# Patient Record
Sex: Male | Born: 1960 | Race: White | Hispanic: No | Marital: Married | Smoking: Never smoker
Health system: Southern US, Community
[De-identification: ages and names within clinical notes are randomized; demographics above are authoritative.]

## PROBLEM LIST (undated history)

## (undated) DIAGNOSIS — I639 Cerebral infarction, unspecified: Secondary | ICD-10-CM

## (undated) DIAGNOSIS — E785 Hyperlipidemia, unspecified: Secondary | ICD-10-CM

## (undated) DIAGNOSIS — K219 Gastro-esophageal reflux disease without esophagitis: Secondary | ICD-10-CM

## (undated) DIAGNOSIS — E041 Nontoxic single thyroid nodule: Secondary | ICD-10-CM

## (undated) HISTORY — DX: Cerebral infarction, unspecified: I63.9

## (undated) HISTORY — DX: Nontoxic single thyroid nodule: E04.1

## (undated) HISTORY — PX: SKIN CANCER EXCISION: SHX779

## (undated) HISTORY — DX: Hyperlipidemia, unspecified: E78.5

## (undated) HISTORY — PX: THYROIDECTOMY, PARTIAL: SHX18

## (undated) HISTORY — DX: Gastro-esophageal reflux disease without esophagitis: K21.9

---

## 2003-12-11 ENCOUNTER — Encounter: Payer: Self-pay | Admitting: Gastroenterology

## 2003-12-11 DIAGNOSIS — K222 Esophageal obstruction: Secondary | ICD-10-CM

## 2003-12-11 DIAGNOSIS — K209 Esophagitis, unspecified without bleeding: Secondary | ICD-10-CM | POA: Insufficient documentation

## 2005-12-12 ENCOUNTER — Ambulatory Visit: Payer: Self-pay | Admitting: Gastroenterology

## 2006-03-09 ENCOUNTER — Encounter: Admission: RE | Admit: 2006-03-09 | Discharge: 2006-03-09 | Payer: Self-pay | Admitting: General Surgery

## 2006-03-09 ENCOUNTER — Encounter (INDEPENDENT_AMBULATORY_CARE_PROVIDER_SITE_OTHER): Payer: Self-pay | Admitting: Specialist

## 2006-03-09 ENCOUNTER — Other Ambulatory Visit: Admission: RE | Admit: 2006-03-09 | Discharge: 2006-03-09 | Payer: Self-pay | Admitting: Interventional Radiology

## 2006-03-19 ENCOUNTER — Inpatient Hospital Stay (HOSPITAL_COMMUNITY): Admission: EM | Admit: 2006-03-19 | Discharge: 2006-03-25 | Payer: Self-pay | Admitting: Emergency Medicine

## 2006-03-28 ENCOUNTER — Encounter: Admission: RE | Admit: 2006-03-28 | Discharge: 2006-04-27 | Payer: Self-pay | Admitting: Neurology

## 2006-06-30 ENCOUNTER — Ambulatory Visit (HOSPITAL_COMMUNITY): Admission: RE | Admit: 2006-06-30 | Discharge: 2006-06-30 | Payer: Self-pay | Admitting: Neurology

## 2006-11-30 ENCOUNTER — Encounter: Admission: RE | Admit: 2006-11-30 | Discharge: 2006-11-30 | Payer: Self-pay | Admitting: General Surgery

## 2006-11-30 ENCOUNTER — Encounter (INDEPENDENT_AMBULATORY_CARE_PROVIDER_SITE_OTHER): Payer: Self-pay | Admitting: *Deleted

## 2006-11-30 ENCOUNTER — Other Ambulatory Visit: Admission: RE | Admit: 2006-11-30 | Discharge: 2006-11-30 | Payer: Self-pay | Admitting: Interventional Radiology

## 2006-12-05 ENCOUNTER — Ambulatory Visit: Payer: Self-pay | Admitting: Gastroenterology

## 2006-12-25 ENCOUNTER — Ambulatory Visit: Payer: Self-pay | Admitting: Internal Medicine

## 2007-01-10 ENCOUNTER — Ambulatory Visit (HOSPITAL_COMMUNITY): Admission: RE | Admit: 2007-01-10 | Discharge: 2007-01-11 | Payer: Self-pay | Admitting: General Surgery

## 2007-01-10 ENCOUNTER — Encounter (INDEPENDENT_AMBULATORY_CARE_PROVIDER_SITE_OTHER): Payer: Self-pay | Admitting: Specialist

## 2008-10-06 IMAGING — CR DG CHEST 2V
2 series · 2 of 2 positions shown · non-contrast
Comparison: 03/19/06.

CLINICAL DATA: Right thyroid nodule.  Preop for partial thyroidectomy. 
 CHEST - 2 VIEW:

[view not recorded (1 of 2)]
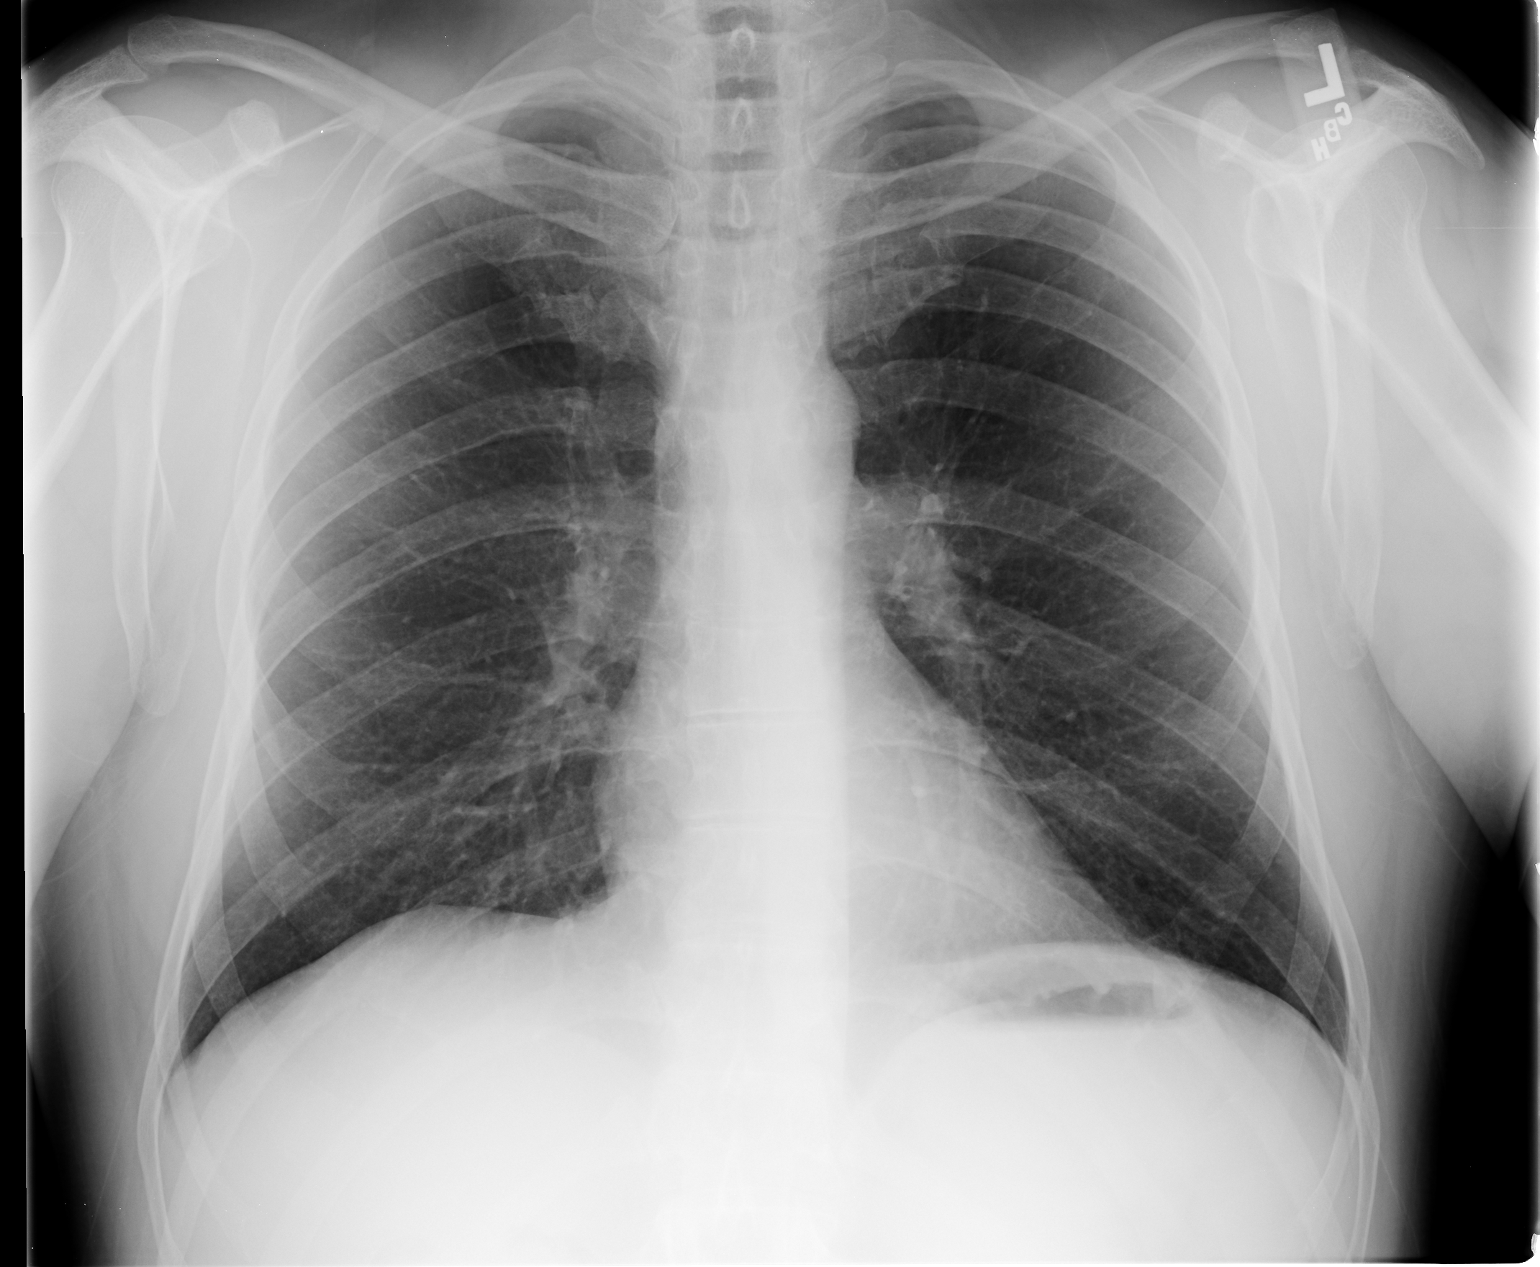

[view not recorded (2 of 2)]
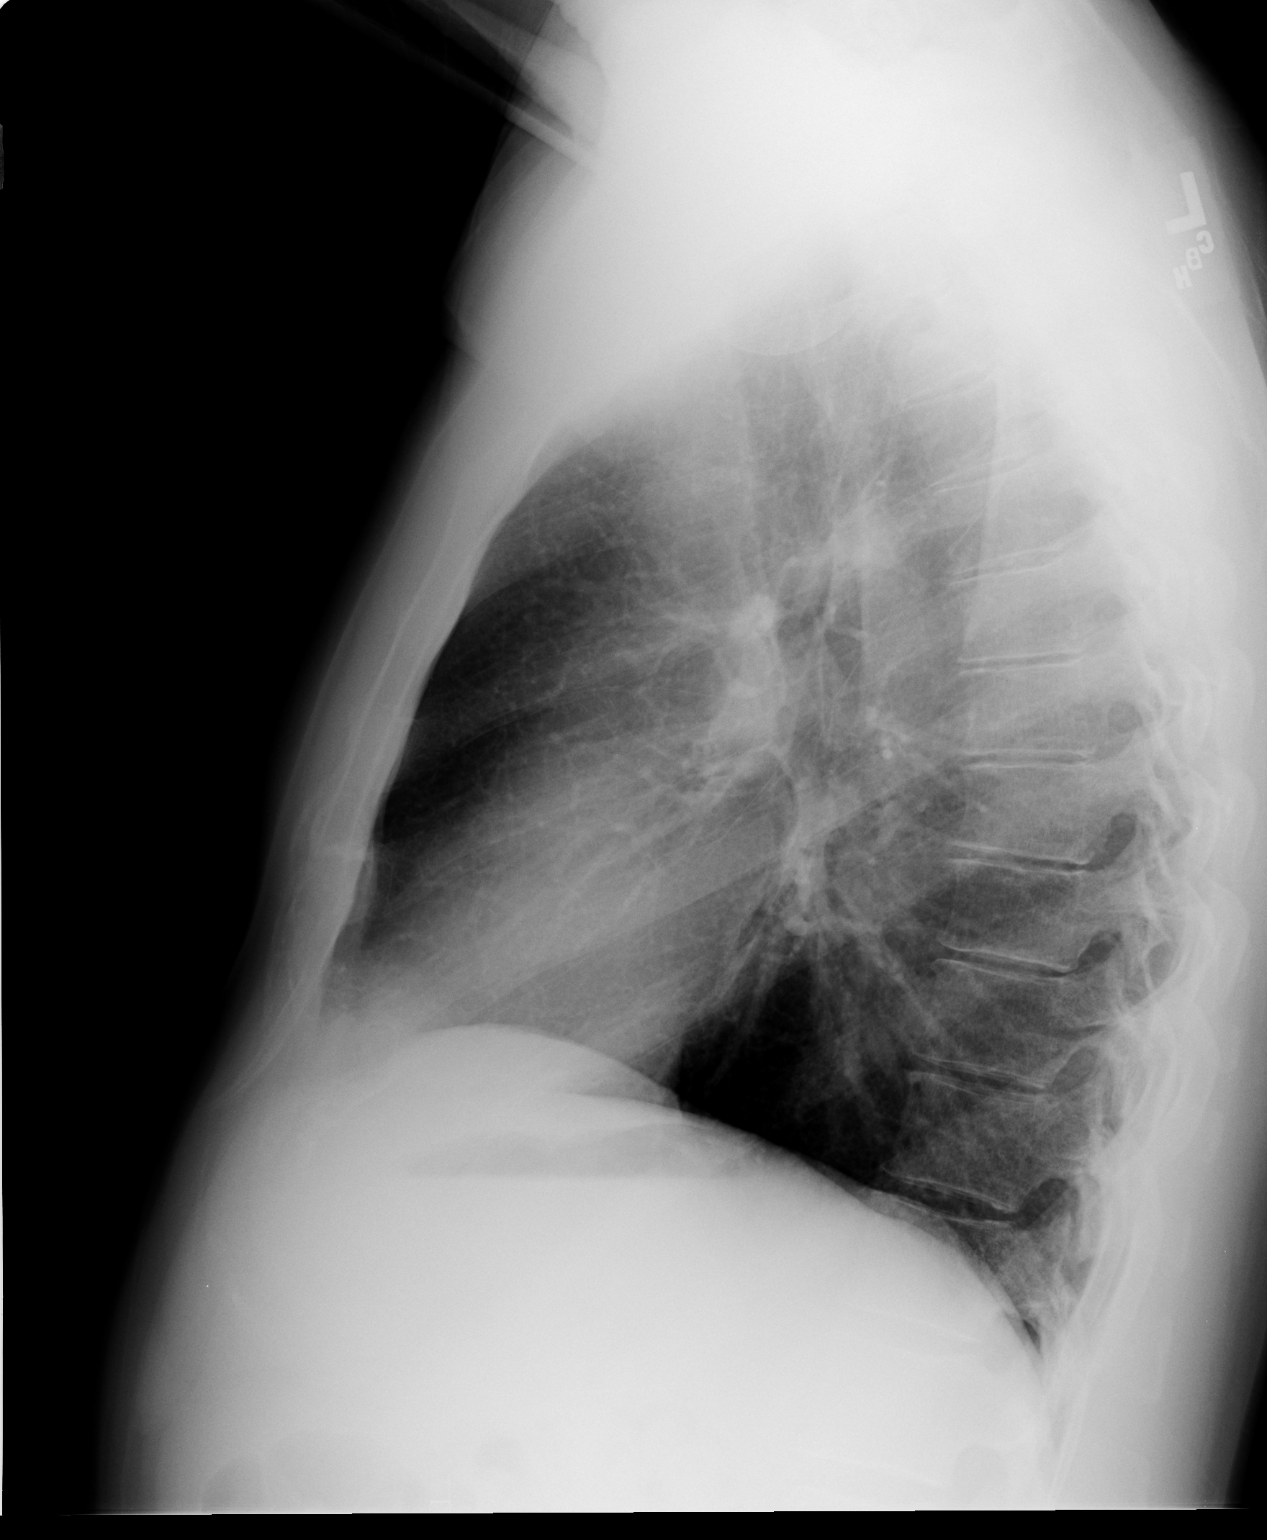

[2 of 2 positions shown; findings below may reference images not displayed]

FINDINGS: Two views of the chest show the lungs to be clear.   The heart is within normal limits in size.  No bony abnormality is seen.
IMPRESSION: No active lung disease.

## 2008-11-14 DIAGNOSIS — I639 Cerebral infarction, unspecified: Secondary | ICD-10-CM

## 2008-11-14 HISTORY — DX: Cerebral infarction, unspecified: I63.9

## 2009-04-03 DIAGNOSIS — Z8679 Personal history of other diseases of the circulatory system: Secondary | ICD-10-CM

## 2009-04-03 DIAGNOSIS — K219 Gastro-esophageal reflux disease without esophagitis: Secondary | ICD-10-CM

## 2009-04-03 DIAGNOSIS — E785 Hyperlipidemia, unspecified: Secondary | ICD-10-CM | POA: Insufficient documentation

## 2009-04-03 DIAGNOSIS — Z8639 Personal history of other endocrine, nutritional and metabolic disease: Secondary | ICD-10-CM

## 2009-04-09 ENCOUNTER — Ambulatory Visit: Payer: Self-pay | Admitting: Gastroenterology

## 2009-05-11 ENCOUNTER — Ambulatory Visit: Payer: Self-pay | Admitting: Gastroenterology

## 2009-05-11 HISTORY — PX: COLONOSCOPY: SHX174

## 2009-08-04 ENCOUNTER — Telehealth: Payer: Self-pay | Admitting: Gastroenterology

## 2011-04-01 NOTE — H&P (Signed)
NAME:  Brian Briggs, Brian Briggs               ACCOUNT NO.:  [id number]   MEDICAL RECORD NO.:  [id number]          PATIENT TYPE:  INP   LOCATION:  3038                         FACILITY:  [facility]   PHYSICIAN:  Pramod P. Pearlean Brownie, MD    DATE OF BIRTH:  [birth year]   DATE OF ADMISSION:  03/19/2006  DATE OF DISCHARGE:                                HISTORY & PHYSICAL   REFERRING PHYSICIAN:  Cathren Laine, M.D.   REASON FOR REFERRAL:  Stroke.   HISTORY OF PRESENT ILLNESS:  Brian Briggs is a 50 year old Caucasian male who  developed episode of sudden onset of gait imbalance with numbness of the  left side of the body yesterday which lasted 5-10 minutes but then  recovered.  Today he had another episode this morning in which he could not  walk right and his left body was numb.  He denied any headache, slurred  speech, loss of vision or focal extremity weakness.  The patient has had no  prior history of stroke, TIA, seizures or significant neurological problems.   PAST MEDICAL HISTORY:  His past medical history is fairly benign except for  hyperlipidemia and gastroesophageal reflux disease.   HOME MEDICATIONS:  Lipitor and Prevacid.   MEDICATION ALLERGIES:  NONE.   PAST SURGICAL HISTORY:  None.   SOCIAL HISTORY:  Patient is married, lives in [place], he works as a [employer] manager.  He does not smoke, he drinks alcohol only socially and  denies doing drugs.   REVIEW OF SYSTEMS:  Review of systems is negative for any chest pain, fever,  cough, shortness of breath, diarrhea or injury.   PHYSICAL EXAMINATION:  GENERAL:  His physical exam reveals a pleasant  Caucasian middle-aged male not in distress.  VITAL SIGNS:  Afebrile, pulse rate 70, respirations 48 per minute regular  sinus, respiratory rate 16 per minute, distal pulses were felt, blood  pressure 125/64.  HEAD:  Head is nontraumatic.  NECK:  Neck is supple without bruit.  ENT:  Exam unremarkable.  CARDIAC EXAM:  No murmur or gallop.  LUNGS:  Clear to auscultation.  NEUROLOGICAL EXAM:  The patient is pleasant, awake, alert, cooperative.  There is no aphasia, apraxia or dysarthria.  Pupils are equal, reactive to  light and accommodation.  He has a dense left homonymous hemianopsia.  Eye  movements are full range without nystagmus.  Face is symmetric.  Palatal  movements normal.  Tongue is midline.  Motor system exam reveals no upper  extremity drift, fine finger movements are symmetric, he has good grip  strength bilaterally.  There is no finger-to-nose ataxia.  He has subjective  numbness in the left forearm and fingertips with no objective sensory loss.  Lower extremity strength, tone, reflexes, coordination, sensation are  symmetric.  Plantars are downgoing.  There is no __________  ataxia.  His  gait was not tested.   DATA REVIEWED:  CT scan of the head noncontrast study done today reveals a  vaguely defined hypodensity in the right posteroparietal region likely from  an acute infarct.  EKG reveals normal sinus rhythm with sinus  bradycardia,  no ischemic changes are noted.  Admission labs are pending at this time.   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  The patient has  presented beyond 24 hours from onset of his symptoms and so he will not  qualify for any aggressive intervention.  He will be admitted to the stroke  unit with telemetry monitoring.  We will start him on IV heparin since he  has had fluctuations in his symptoms until the stroke workup is completed.  Check MRI scan of the brain with MRA of the brain.  __________  and Doppler  studies, hemoglobin A1c with lipid profile and homocysteine.  Also check  coagulation labs for hypercoagulability given the fact that his only risk  factor is elevated cholesterol.  Physical and occupational therapy  consults.  I had a long discussion with the patient and his mother and wife with  regards to his symptoms, discussed plan for evaluation and treatment and  answered questions.           ______________________________  Sunny Schlein. Pearlean Brownie, MD     PPS/MEDQ  D:  03/19/2006  T:  03/19/2006  Job:  [id number]

## 2011-04-01 NOTE — Op Note (Signed)
NAME:  Brian Briggs, PIATKOWSKI NO.:  [id number]   MEDICAL RECORD NO.:  [id number]          PATIENT TYPE:  OIB   LOCATION:  2550                         FACILITY:  [facility]   PHYSICIAN:  Ollen Gross. Vernell Morgans, M.D. DATE OF BIRTH:  June 08, 1961   DATE OF PROCEDURE:  01/10/2007  DATE OF DISCHARGE:                               OPERATIVE REPORT   PREOPERATIVE DIAGNOSIS:  Right thyroid nodule.   POSTOPERATIVE DIAGNOSES:  Right thyroid nodule.   PROCEDURES:  Right thyroid lobectomy.   SURGEON:  Dr. Carolynne Edouard   ASSISTANT:  Dr. Lurene Shadow   ANESTHESIA:  General endotracheal.   PROCEDURE:  After informed consent was obtained, the patient was brought  to the operating room, placed in supine position on the table.  After  induction of general anesthesia, a roll was placed behind the patient's  shoulders to extend the neck slightly. The neck and chest area was then  prepped with Betadine and draped in usual sterile manner.  A low  transverse collar incision was made with a 15 blade knife.  This  incision was carried down through the skin and subcutaneous tissue  sharply with electrocautery.  The platysma layer was also divided with  electrocautery.  Allis clamps were used to grasp the platysma and  subplatysmal flaps were created both superiorly and inferiorly and both  blunt finger dissection and some sharp dissection with electrocautery.  Once this was accomplished, a Adult nurse was deployed.  The  dissection was then carried down along the midline with electrocautery  until the trachea was identified.  The patient appeared, did not appear  to have an isthmus to his thyroid. His right thyroid lobe was  identified.  The strap muscles were cleared by a combination of sharp  dissection with electrocautery and blunt dissection with a Kitner.  The  strap muscles were cleared from the anterior surface of the thyroid  gland of the right thyroid lobe. Gentle finger traction on the right  thyroid lobe pulled the thyroid medially and anteriorly.  A couple of  small vessels at the lateral edge of the thyroid gland were controlled  with small vascular clips.  This helped the thyroid lobe mobilize more  medially and the superior pole of the right thyroid lobe was dissected  free circumferentially using a right-angle clamp. The superior pole of  the right thyroid lobe was then controlled with two 2-0 silk ties and  medium clip on the stay side and the superior pole was divided between  these two. The rest of the superior pole was taken down with the  harmonic scalpel.  Care was taken to identify the parathyroid glands and  the recurrent laryngeal nerve. The parathyroid glands were gently  dissected away from the surface of the thyroid by blunt right angle  dissection and again some small vessels in this area were controlled  with small vascular clips.  The recurrent laryngeal nerve appeared to be  very deep to where we were dissecting. We did shave the thyroid gland  away from this area sharply with electrocautery taking care to stay well  away from the nerve.  Once this was accomplished, the rest of the  attachments of the thyroid gland to the anterior trachea were taken down  with the harmonic scalpel.  The right lobe was then freed from the  patient and removed. A stitch was used to mark the superior pole and was  sent to pathology for frozen section. The frozen section on this  appeared to be benign. The wound was then irrigated with copious amounts  of saline. The operative bed appeared to be completely hemostatic.  Small piece of Surgicel was placed in the operative bed.  The strap  muscles were then reapproximated along the midline with figure-of-eight  3-0 Vicryl stitches.  The platysma was also reapproximated  with interrupted 3-0 Vicryl stitches and the skin was closed with a  running 4-0 Monocryl subcuticular stitch.  Benzoin, Steri-Strips and  sterile dressings were  applied.  Prior to closing the left lobe was  palpated and felt normal.  No adenopathy was appreciated. The patient  was then awakened and taken to recovery in stable condition.      Ollen Gross. Vernell Morgans, M.D.  Electronically Signed     PST/MEDQ  D:  01/10/2007  T:  01/10/2007  Job:  [id number]

## 2011-04-01 NOTE — Assessment & Plan Note (Signed)
[facility]                            CARDIOLOGY OFFICE NOTE   LIONELL, MATUSZAK                      MRN:          [id number]  DATE:12/25/2006                            DOB:          December 21, 1960    IDENTIFICATION:  [name] is a 50 year old who is referred for  preoperative risk stratification.   HISTORY OF PRESENT ILLNESS:  The patient has no known history of heart  problems.  Note in spring of last year he suffered a CVA.  Etiology was  not clear, though the MRI suggests he may have had a focal dissection.  He was on Coumadin therapy for a short time and now on aspirin and  Aggrenox, has had complete resolution of his symptoms.   The patient is very active.  He works in [employer], is on the job  site.  He busts wood up.  Denies problems with shortness of breath, no  chest pain.   He is being evaluated for removal of a thyroid nodule.   ALLERGIES:  None.   MEDICATIONS:  1. Aggrenox b.i.d.  2. Aspirin 81 mg daily.  3. Lipitor 40 mg daily.  4. Pioglitazone 45 mg daily.  5. IRIS study drug.  6. Prilosec.   PAST MEDICAL HISTORY:  1. History of a CVA.  2. History of dyslipidemia.  I do not have his lipids.  3. GE reflux.  4. Thyroid nodule.   SOCIAL HISTORY:  The patient does not smoke, drinks caffeine but no  alcohol.   FAMILY HISTORY:  Paternal grandfather and maternal grandfather with CAD;  otherwise negative.   REVIEW OF SYSTEMS:  All systems reviewed.  Negative to the above problem  except as noted.   PHYSICAL EXAMINATION:  GENERAL:  The patient is in no distress.  VITAL SIGNS:  Blood pressure is 126/75, pulse is 55 and regular, weight  178.  HEENT:  Normocephalic, atraumatic.  EOMI, PERRLA.  Throat clear.  NECK:  Small nodule noted in the right neck just right of midline.  No  bruits.  LUNGS:  Clear.  CARDIAC:  Regular rate and rhythm.  S1, S2.  No S3, no murmurs.  ABDOMEN:  Benign.  EXTREMITIES:  Good distal  pulses, no edema.   A 12-lead EKG:  Sinus bradycardia 52 beats per minute.  ST changes  consistent with early repolarization.   IMPRESSION:  [name] is a 50 year old gentleman, history significant  for a cerebrovascular accident.  MRA would suggest that it was a focal  dissection.  He is on antiplatelet therapy and vasodilator therapy.  He  is also being treated for dyslipidemia; I do not have that lipid panel.   Overall, he is very active.  I do not think he is at any increased risk  for a cardiac event.  I think overall he is at a low risk with the  upcoming surgery.  I do not think any further testing is indicated.   I will follow up with his blood work but otherwise have not set a  followup here in cardiology clinic.     Sherol Dade  Tenny Craw, MD, [facility]  Electronically Signed    PVR/MedQ  DD: 12/25/2006  DT: 12/26/2006  Job #: [id number]   cc:   Lindaann Pascal, PA

## 2011-04-01 NOTE — Assessment & Plan Note (Signed)
[facility]                         GASTROENTEROLOGY OFFICE NOTE   Brian Briggs, Brian Briggs                      MRN:          [id number]  DATE:12/05/2006                            DOB:          [birth year]    PROBLEM:  Reflux.   Brian Briggs has returned for interval GI followup.  He has a history of  esophageal stricture and GERD.  He continues on Prilosec which he has to  take daily.  On this regimen, he has no GI complaints.  Specifically, he  is without pyrosis or dysphagia.  Since his last visit, he apparently  had a CVA for which he is now taking Aggrenox and baby aspirin.   ON EXAM:  VITAL SIGNS:  Pulse 80.  Blood pressure 120/70.  Weight 191.   IMPRESSION:  1. Gastroesophageal reflux disease - well-controlled on Prilosec.  2. History of esophageal stricture - asymptomatic since dilatation      therapy 2 years ago.   RECOMMENDATIONS:  Continue Prilosec.     Barbette Hair. Arlyce Dice, MD,FACG  Electronically Signed    RDK/MedQ  DD: 12/05/2006  DT: 12/05/2006  Job #: [id number]   cc:   Ernestina Penna, M.D.

## 2011-04-01 NOTE — Discharge Summary (Signed)
NAME:  Brian Briggs, Brian Briggs NO.:  [id number]   MEDICAL RECORD NO.:  [id number]          PATIENT TYPE:  INP   LOCATION:  3038                         FACILITY:  [facility]   PHYSICIAN:  Casimiro Needle L. Reynolds, M.D.DATE OF BIRTH:  1961-05-10   DATE OF ADMISSION:  03/19/2006  DATE OF DISCHARGE:  03/25/2006                                 DISCHARGE SUMMARY   ADMISSION DIAGNOSES:  1.  Right middle cerebral artery distribution infarct of uncertain etiology.  2.  Hyperlipidemia.   DISCHARGE DIAGNOSES:  1.  Right middle cerebral artery territory infarct with resultant left      visual field loss, hemisensory changes, and gait ataxia, all improved.  2.  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  3.  Chronic anticoagulation.  4.  Hyperlipidemia, treated.  5.  Gastroesophageal reflux disease.   CONDITION ON DISCHARGE:  Improved.   DIET:  Low-fat, low-salt, low-cholesterol diet.   ACTIVITY:  Ad lib with no driving.   MEDICATIONS AT DISCHARGE:  1.  Coumadin 4 mg p.o. daily.  2.  Aspirin 81 mg p.o. daily.  3.  Depakote 500 mg ER q.h.s.  4.  Lipitor 20  mg p.o. daily.  5.  Prevacid 30 mg p.o. daily.   STUDIES:  1.  CT of the head on Mar 19, 2006, demonstrating acute nonhemorrhagic right      middle cerebral artery distribution stroke involving the parietal lobe      and old lacunar infarcts in the basal ganglia.  2.  MRI of the brain on Mar 20, 2006, demonstrating subacute infarct in the      posterior right MCA distribution and absent flow in the distal right      internal carotid artery.  3.  MRA of the intracranial circulation demonstrating slow flow or occlusion      in the distal right ICA with reconstitution at the level of the      ophthalmic artery.  4.  MR angiography of the neck with contrast demonstrating string sign      versus occlusion of the right internal carotid artery.  5.  EKG on Mar 19, 2006, showed sinus bradycardia, otherwise unremarkable.  6.  2-D echocardiogram on Mar 20, 2006, demonstrating normal LV function and      no evidence of an embolic source.  7.  Carotid Doppler study performed on Mar 20, 2006, demonstrated right ICA      occlusion and left ICA with compensatory flow.   LABORATORY REVIEW:  Serial CBCs unremarkable except for development of a  minimal normocytic anemia later in the hospital course with a discharge  hemoglobin of 12.3.  INR on discharge 2.4, up from 1.7 on Mar 24, 2006.  Evaluation for antiphospholipid antibody and lupus anticoagulant negative.  Repeat chemistries normal.  Hemoglobin A1c normal at 5.4. homocysteine  normal at 12.8. Lipid profile normal with total cholesterol of 99,  HDL 41,  LDL 40. Urine drug screen negative.   HOSPITAL COURSE:  Please see admission H&P by Dr. Pearlean Brownie for full  admission  details. Briefly, this is a 50 year old male who presented to the emergency  room on Mar 19, 2006, with left side paresthesia and gait ataxia which has  been fluctuating over the course of two days. He was admitted to the stroke  service after the CT scan demonstrated a cerebral infarct. He underwent  workup with the above results. Because of his right carotid artery occlusion  it was felt that short-term anticoagulation for a few months was the most  appropriate therapy for stroke prophylaxis. He was placed on heparin and  Coumadin was started. Physical therapy, occupational therapy, and speech  therapy were all consulted as part of his rehabilitation. He did develop  some headaches following his stroke, which was migrainous in character, and  responded well to Depakote. He was continued on Depakote at the time of  discharge for this. By Mar 24, 2006, he was felt by his therapist to be good  enough for outpatient therapy. By the morning of Mar 25, 2006,  he achieved  therapeutic INR and was felt stable for discharge.   FOLLOWUP:  He will  follow up with the office of his primary care physician,  Dr. Lindaann Pascal, for check of his INR. Again, his INR was 2.4 on Mar 25, 2006, and he received 7.5 mg of Coumadin on Mar 23, 2006, through Mar 24, 2006, and will be discharged with 4 mg of Coumadin daily. He will follow up  with Dr. Jacqulyn Bath on a routine basis and Dr. Jacqulyn Bath will be managing his Coumadin.  He is asked to call [facility] at [phone] for an  appointment with Dr. Delia Heady in two to three months.      Michael L. Thad Ranger, M.D.  Electronically Signed     MLR/MEDQ  D:  03/25/2006  T:  03/26/2006  Job:  [id number]

## 2011-04-14 ENCOUNTER — Encounter: Payer: Self-pay | Admitting: Physician Assistant

## 2013-07-04 ENCOUNTER — Other Ambulatory Visit: Payer: Self-pay | Admitting: Family Medicine

## 2013-07-05 ENCOUNTER — Other Ambulatory Visit: Payer: Self-pay

## 2013-07-05 ENCOUNTER — Ambulatory Visit: Payer: Self-pay | Admitting: Family Medicine

## 2013-07-05 MED ORDER — ATORVASTATIN CALCIUM 40 MG PO TABS: 40.0000 mg | ORAL_TABLET | Freq: Every day | ORAL | Status: DC

## 2013-07-05 NOTE — Telephone Encounter (Signed)
Last seen 12/19/12  ACM

## 2013-07-05 NOTE — Telephone Encounter (Signed)
Last seen 12/19/12  ACM 

## 2013-07-10 ENCOUNTER — Ambulatory Visit (INDEPENDENT_AMBULATORY_CARE_PROVIDER_SITE_OTHER): Payer: BC Managed Care – PPO | Admitting: Family Medicine

## 2013-07-10 ENCOUNTER — Encounter: Payer: Self-pay | Admitting: Family Medicine

## 2013-07-10 VITALS — BP 126/67 | HR 59 | Temp 97.5°F | Ht 70.0 in | Wt 180.0 lb

## 2013-07-10 DIAGNOSIS — I639 Cerebral infarction, unspecified: Secondary | ICD-10-CM

## 2013-07-10 DIAGNOSIS — Z0000 Encounter for general adult medical examination without abnormal findings: Secondary | ICD-10-CM

## 2013-07-10 DIAGNOSIS — E785 Hyperlipidemia, unspecified: Secondary | ICD-10-CM

## 2013-07-10 LAB — POCT CBC
Granulocyte percent: 52.2 %G (ref 37–80)
HCT, POC: 44.3 % (ref 43.5–53.7)
Hemoglobin: 15.1 g/dL (ref 14.1–18.1)
Lymph, poc: 2.1 (ref 0.6–3.4)
MCH, POC: 30.8 pg (ref 27–31.2)
MCHC: 34 g/dL (ref 31.8–35.4)
MCV: 90.7 fL (ref 80–97)
MPV: 7.4 fL (ref 0–99.8)
POC Granulocyte: 2.5 (ref 2–6.9)
POC LYMPH PERCENT: 43.8 %L (ref 10–50)
Platelet Count, POC: 186 10*3/uL (ref 142–424)
RBC: 4.9 M/uL (ref 4.69–6.13)
RDW, POC: 12.4 %
WBC: 4.7 10*3/uL (ref 4.6–10.2)

## 2013-07-10 NOTE — Progress Notes (Signed)
  Subjective:    Patient ID: Brian Briggs, male    DOB: 15-Jun-1961, 52 y.o.   MRN: [id number]  HPI This 52 y.o. male presents for evaluation of CPE.  He has hx of right MCA CVA.  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  He states He suffers only some mild visual defecits and was only out of work for 90 days and has done well. He was seen by neurology who rx'd aggrenox and ASA 81mg  po qd. He does get GERD from time to time And this is controlled with prilosec 40mg  po qd.  He has hyperlipidemia nad takes lipitor 40mg  po qd. He has had colonoscopy 3 years ago and it was normal.  He has hx of thyroid nodules and subsequent surgery. He has been doing fine and denies any acute medical issues.   Review of Systems    No chest pain, SOB, HA, dizziness, vision change, N/V, diarrhea, constipation, dysuria, urinary urgency or frequency, myalgias, arthralgias or rash.  Objective:   Physical Exam Vital signs noted  Well developed well nourished male.  HEENT - Head atraumatic Normocephalic                Eyes - PERRLA, Conjuctiva - clear Sclera- Clear EOMI                Ears - EAC's Wnl TM's Wnl Gross Hearing WNL                Nose - Nares patent                 Throat - oropharanx wnl Respiratory - Lungs CTA bilateral Cardiac - RRR S1 and S2 without murmur GI - Abdomen soft Nontender and bowel sounds active x 4. GU - Testes without masses, circumcised normal male phalus and no inguinal hernia. Rectal - Rectal tone normal, prostate mildly enlarged without masses and is smooth, hemocult stool negative. Extremities - No edema. Neuro - Grossly intact.       Assessment & Plan:  Other and unspecified hyperlipidemia - Plan: Lipid panel and continue lipitor.  Routine general medical examination at a health care facility - Plan: CMP14+EGFR, Lipid panel, POCT CBC, Thyroid Panel With TSH, PSA, total and free.  Follow up colonoscopy in 7  years.  CVA (cerebral infarction) - Plan: Continue aggrenox and ASA.  Check lipid panel.  GERD - Controlled with prilosec.  Follow up in 6 months.

## 2013-07-10 NOTE — Patient Instructions (Signed)
Hypertriglyceridemia  Diet for High blood levels of Triglycerides Most fats in food are triglycerides. Triglycerides in your blood are stored as fat in your body. High levels of triglycerides in your blood may put you at a greater risk for heart disease and stroke.  Normal triglyceride levels are less than 150 mg/dL. Borderline high levels are 150-199 mg/dl. High levels are 200 - 499 mg/dL, and very high triglyceride levels are greater than 500 mg/dL. The decision to treat high triglycerides is generally based on the level. For people with borderline or high triglyceride levels, treatment includes weight loss and exercise. Drugs are recommended for people with very high triglyceride levels. Many people who need treatment for high triglyceride levels have metabolic syndrome. This syndrome is a collection of disorders that often include: insulin resistance, high blood pressure, blood clotting problems, high cholesterol and triglycerides. TESTING PROCEDURE FOR TRIGLYCERIDES  You should not eat 4 hours before getting your triglycerides measured. The normal range of triglycerides is between 10 and 250 milligrams per deciliter (mg/dl). Some people may have extreme levels (1000 or above), but your triglyceride level may be too high if it is above 150 mg/dl, depending on what other risk factors you have for heart disease.  People with high blood triglycerides may also have high blood cholesterol levels. If you have high blood cholesterol as well as high blood triglycerides, your risk for heart disease is probably greater than if you only had high triglycerides. High blood cholesterol is one of the main risk factors for heart disease. CHANGING YOUR DIET  Your weight can affect your blood triglyceride level. If you are more than 20% above your ideal body weight, you may be able to lower your blood triglycerides by losing weight. Eating less and exercising regularly is the best way to combat this. Fat provides more  calories than any other food. The best way to lose weight is to eat less fat. Only 30% of your total calories should come from fat. Less than 7% of your diet should come from saturated fat. A diet low in fat and saturated fat is the same as a diet to decrease blood cholesterol. By eating a diet lower in fat, you may lose weight, lower your blood cholesterol, and lower your blood triglyceride level.  Eating a diet low in fat, especially saturated fat, may also help you lower your blood triglyceride level. Ask your dietitian to help you figure how much fat you can eat based on the number of calories your caregiver has prescribed for you.  Exercise, in addition to helping with weight loss may also help lower triglyceride levels.   Alcohol can increase blood triglycerides. You may need to stop drinking alcoholic beverages.  Too much carbohydrate in your diet may also increase your blood triglycerides. Some complex carbohydrates are necessary in your diet. These may include bread, rice, potatoes, other starchy vegetables and cereals.  Reduce "simple" carbohydrates. These may include pure sugars, candy, honey, and jelly without losing other nutrients. If you have the kind of high blood triglycerides that is affected by the amount of carbohydrates in your diet, you will need to eat less sugar and less high-sugar foods. Your caregiver can help you with this.  Adding 2-4 grams of fish oil (EPA+ DHA) may also help lower triglycerides. Speak with your caregiver before adding any supplements to your regimen. Following the Diet  Maintain your ideal weight. Your caregivers can help you with a diet. Generally, eating less food and getting more   exercise will help you lose weight. Joining a weight control group may also help. Ask your caregivers for a good weight control group in your area.  Eat low-fat foods instead of high-fat foods. This can help you lose weight too.  These foods are lower in fat. Eat MORE of these:    Dried beans, peas, and lentils.  Egg whites.  Low-fat cottage cheese.  Fish.  Lean cuts of meat, such as round, sirloin, rump, and flank (cut extra fat off meat you fix).  Whole grain breads, cereals and pasta.  Skim and nonfat dry milk.  Low-fat yogurt.  Poultry without the skin.  Cheese made with skim or part-skim milk, such as mozzarella, parmesan, farmers', ricotta, or pot cheese. These are higher fat foods. Eat LESS of these:   Whole milk and foods made from whole milk, such as American, blue, cheddar, monterey jack, and swiss cheese  High-fat meats, such as luncheon meats, sausages, knockwurst, bratwurst, hot dogs, ribs, corned beef, ground pork, and regular ground beef.  Fried foods. Limit saturated fats in your diet. Substituting unsaturated fat for saturated fat may decrease your blood triglyceride level. You will need to read package labels to know which products contain saturated fats.  These foods are high in saturated fat. Eat LESS of these:   Fried pork skins.  Whole milk.  Skin and fat from poultry.  Palm oil.  Butter.  Shortening.  Cream cheese.  Bacon.  Margarines and baked goods made from listed oils.  Vegetable shortenings.  Chitterlings.  Fat from meats.  Coconut oil.  Palm kernel oil.  Lard.  Cream.  Sour cream.  Fatback.  Coffee whiteners and non-dairy creamers made with these oils.  Cheese made from whole milk. Use unsaturated fats (both polyunsaturated and monounsaturated) moderately. Remember, even though unsaturated fats are better than saturated fats; you still want a diet low in total fat.  These foods are high in unsaturated fat:   Canola oil.  Sunflower oil.  Mayonnaise.  Almonds.  Peanuts.  Pine nuts.  Margarines made with these oils.  Safflower oil.  Olive oil.  Avocados.  Cashews.  Peanut butter.  Sunflower seeds.  Soybean oil.  Peanut  oil.  Olives.  Pecans.  Walnuts.  Pumpkin seeds. Avoid sugar and other high-sugar foods. This will decrease carbohydrates without decreasing other nutrients. Sugar in your food goes rapidly to your blood. When there is excess sugar in your blood, your liver may use it to make more triglycerides. Sugar also contains calories without other important nutrients.  Eat LESS of these:   Sugar, brown sugar, powdered sugar, jam, jelly, preserves, honey, syrup, molasses, pies, candy, cakes, cookies, frosting, pastries, colas, soft drinks, punches, fruit drinks, and regular gelatin.  Avoid alcohol. Alcohol, even more than sugar, may increase blood triglycerides. In addition, alcohol is high in calories and low in nutrients. Ask for sparkling water, or a diet soft drink instead of an alcoholic beverage. Suggestions for planning and preparing meals   Bake, broil, grill or roast meats instead of frying.  Remove fat from meats and skin from poultry before cooking.  Add spices, herbs, lemon juice or vinegar to vegetables instead of salt, rich sauces or gravies.  Use a non-stick skillet without fat or use no-stick sprays.  Cool and refrigerate stews and broth. Then remove the hardened fat floating on the surface before serving.  Refrigerate meat drippings and skim off fat to make low-fat gravies.  Serve more fish.  Use less butter,   margarine and other high-fat spreads on bread or vegetables.  Use skim or reconstituted non-fat dry milk for cooking.  Cook with low-fat cheeses.  Substitute low-fat yogurt or cottage cheese for all or part of the sour cream in recipes for sauces, dips or congealed salads.  Use half yogurt/half mayonnaise in salad recipes.  Substitute evaporated skim milk for cream. Evaporated skim milk or reconstituted non-fat dry milk can be whipped and substituted for whipped cream in certain recipes.  Choose fresh fruits for dessert instead of high-fat foods such as pies or  cakes. Fruits are naturally low in fat. When Dining Out   Order low-fat appetizers such as fruit or vegetable juice, pasta with vegetables or tomato sauce.  Select clear, rather than cream soups.  Ask that dressings and gravies be served on the side. Then use less of them.  Order foods that are baked, broiled, poached, steamed, stir-fried, or roasted.  Ask for margarine instead of butter, and use only a small amount.  Drink sparkling water, unsweetened tea or coffee, or diet soft drinks instead of alcohol or other sweet beverages. QUESTIONS AND ANSWERS ABOUT OTHER FATS IN THE BLOOD: SATURATED FAT, TRANS FAT, AND CHOLESTEROL What is trans fat? Trans fat is a type of fat that is formed when vegetable oil is hardened through a process called hydrogenation. This process helps makes foods more solid, gives them shape, and prolongs their shelf life. Trans fats are also called hydrogenated or partially hydrogenated oils.  What do saturated fat, trans fat, and cholesterol in foods have to do with heart disease? Saturated fat, trans fat, and cholesterol in the diet all raise the level of LDL "bad" cholesterol in the blood. The higher the LDL cholesterol, the greater the risk for coronary heart disease (CHD). Saturated fat and trans fat raise LDL similarly.  What foods contain saturated fat, trans fat, and cholesterol? High amounts of saturated fat are found in animal products, such as fatty cuts of meat, chicken skin, and full-fat dairy products like butter, whole milk, cream, and cheese, and in tropical vegetable oils such as palm, palm kernel, and coconut oil. Trans fat is found in some of the same foods as saturated fat, such as vegetable shortening, some margarines (especially hard or stick margarine), crackers, cookies, baked goods, fried foods, salad dressings, and other processed foods made with partially hydrogenated vegetable oils. Small amounts of trans fat also occur naturally in some animal  products, such as milk products, beef, and lamb. Foods high in cholesterol include liver, other organ meats, egg yolks, shrimp, and full-fat dairy products. How can I use the new food label to make heart-healthy food choices? Check the Nutrition Facts panel of the food label. Choose foods lower in saturated fat, trans fat, and cholesterol. For saturated fat and cholesterol, you can also use the Percent Daily Value (%DV): 5% DV or less is low, and 20% DV or more is high. (There is no %DV for trans fat.) Use the Nutrition Facts panel to choose foods low in saturated fat and cholesterol, and if the trans fat is not listed, read the ingredients and limit products that list shortening or hydrogenated or partially hydrogenated vegetable oil, which tend to be high in trans fat. POINTS TO REMEMBER:   Discuss your risk for heart disease with your caregivers, and take steps to reduce risk factors.  Change your diet. Choose foods that are low in saturated fat, trans fat, and cholesterol.  Add exercise to your daily routine if   it is not already being done. Participate in physical activity of moderate intensity, like brisk walking, for at least 30 minutes on most, and preferably all days of the week. No time? Break the 30 minutes into three, 10-minute segments during the day.  Stop smoking. If you do smoke, contact your caregiver to discuss ways in which they can help you quit.  Do not use street drugs.  Maintain a normal weight.  Maintain a healthy blood pressure.  Keep up with your blood work for checking the fats in your blood as directed by your caregiver. Document Released: 08/18/2004 Document Revised: 05/01/2012 Document Reviewed: 03/16/2009 [employer] Patient Information 2014 [employer].  

## 2013-07-11 LAB — PSA, TOTAL AND FREE
PSA, Free Pct: 62 %
PSA, Free: 0.31 ng/mL
PSA: 0.5 ng/mL (ref 0.0–4.0)

## 2013-07-11 LAB — THYROID PANEL WITH TSH
Free Thyroxine Index: 2 (ref 1.2–4.9)
T3 Uptake Ratio: 30 % (ref 24–39)
T4, Total: 6.6 ug/dL (ref 4.5–12.0)
TSH: 3.66 u[IU]/mL (ref 0.450–4.500)

## 2013-07-11 LAB — CMP14+EGFR
ALT: 10 IU/L (ref 0–44)
AST: 21 IU/L (ref 0–40)
Albumin/Globulin Ratio: 2.2 (ref 1.1–2.5)
Albumin: 4.6 g/dL (ref 3.5–5.5)
Alkaline Phosphatase: 73 IU/L (ref 39–117)
BUN/Creatinine Ratio: 16 (ref 9–20)
BUN: 13 mg/dL (ref 6–24)
CO2: 25 mmol/L (ref 18–29)
Calcium: 9.7 mg/dL (ref 8.7–10.2)
Chloride: 103 mmol/L (ref 97–108)
Creatinine, Ser: 0.82 mg/dL (ref 0.76–1.27)
GFR calc Af Amer: 116 mL/min/{1.73_m2} (ref >59)
GFR calc non Af Amer: 100 mL/min/{1.73_m2} (ref >59)
Globulin, Total: 2.1 g/dL (ref 1.5–4.5)
Glucose: 96 mg/dL (ref 65–99)
Potassium: 4.8 mmol/L (ref 3.5–5.2)
Sodium: 144 mmol/L (ref 134–144)
Total Bilirubin: 0.6 mg/dL (ref 0.0–1.2)
Total Protein: 6.7 g/dL (ref 6.0–8.5)

## 2013-07-11 LAB — LIPID PANEL
Chol/HDL Ratio: 2.8 ratio units (ref 0.0–5.0)
Cholesterol, Total: 151 mg/dL (ref 100–199)
HDL: 53 mg/dL (ref >39)
LDL Calculated: 77 mg/dL (ref 0–99)
Triglycerides: 103 mg/dL (ref 0–149)
VLDL Cholesterol Cal: 21 mg/dL (ref 5–40)

## 2013-08-05 ENCOUNTER — Other Ambulatory Visit: Payer: Self-pay | Admitting: Nurse Practitioner

## 2013-08-05 NOTE — Telephone Encounter (Signed)
Message copied by Bearl Mulberry on Mon Aug 05, 2013 11:09 AM ------      Message from: Deatra Canter      Created: Thu Jul 11, 2013  8:21 AM       Labs look good ------

## 2014-01-23 ENCOUNTER — Encounter (INDEPENDENT_AMBULATORY_CARE_PROVIDER_SITE_OTHER): Payer: Self-pay

## 2014-01-23 ENCOUNTER — Ambulatory Visit (INDEPENDENT_AMBULATORY_CARE_PROVIDER_SITE_OTHER): Payer: BC Managed Care – PPO | Admitting: Family Medicine

## 2014-01-23 ENCOUNTER — Encounter: Payer: Self-pay | Admitting: Family Medicine

## 2014-01-23 VITALS — BP 114/72 | HR 56 | Temp 97.8°F | Ht 71.0 in | Wt 180.8 lb

## 2014-01-23 DIAGNOSIS — E785 Hyperlipidemia, unspecified: Secondary | ICD-10-CM

## 2014-01-23 DIAGNOSIS — I6350 Cerebral infarction due to unspecified occlusion or stenosis of unspecified cerebral artery: Secondary | ICD-10-CM

## 2014-01-23 DIAGNOSIS — K219 Gastro-esophageal reflux disease without esophagitis: Secondary | ICD-10-CM

## 2014-01-23 DIAGNOSIS — I639 Cerebral infarction, unspecified: Secondary | ICD-10-CM

## 2014-01-23 MED ORDER — ATORVASTATIN CALCIUM 40 MG PO TABS: 40.0000 mg | ORAL_TABLET | Freq: Every day | ORAL | Status: DC

## 2014-01-23 MED ORDER — ASPIRIN-DIPYRIDAMOLE ER 25-200 MG PO CP12: 1.0000 | ORAL_CAPSULE | Freq: Two times a day (BID) | ORAL | Status: DC

## 2014-01-23 MED ORDER — OMEPRAZOLE 40 MG PO CPDR: 40.0000 mg | DELAYED_RELEASE_CAPSULE | Freq: Every day | ORAL | Status: DC

## 2014-01-23 NOTE — Patient Instructions (Signed)
Continue current medications. Continue good therapeutic lifestyle changes which include good diet and exercise. Fall precautions discussed with patient. If an FOBT was given today- please return it to our front desk. If you are over 55 years old - you may need Prevnar 13 or the adult Pneumonia vaccine.                        

## 2014-01-23 NOTE — Progress Notes (Signed)
   Subjective:    Patient ID: Donivan ScullOrville D Sukup, male    DOB: [birth year], 53 y.o.   MRN: [id number]  HPI This 53 y.o. male presents for evaluation of routine visit and needing refills.   Review of Systems    No chest pain, SOB, HA, dizziness, vision change, N/V, diarrhea, constipation, dysuria, urinary urgency or frequency, myalgias, arthralgias or rash.  Objective:   Physical Exam  Vital signs noted  Well developed well nourished male.  HEENT - Head atraumatic Normocephalic                Eyes - PERRLA, Conjuctiva - clear Sclera- Clear EOMI                Ears - EAC's Wnl TM's Wnl Gross Hearing WNL                Nose - Nares patent                 Throat - oropharanx wnl Respiratory - Lungs CTA bilateral Cardiac - RRR S1 and S2 without murmur GI - Abdomen soft Nontender and bowel sounds active x 4 Extremities - No edema. Neuro - Grossly intact.      Assessment & Plan:  Other and unspecified hyperlipidemia - Plan: atorvastatin (LIPITOR) 40 MG tablet  GERD (gastroesophageal reflux disease) - Plan: omeprazole (PRILOSEC) 40 MG capsule  CVA (cerebral infarction) - Plan: dipyridamole-aspirin (AGGRENOX) 200-25 MG per 12 hr capsule  Follow up in one year  Deatra CanterWilliam J Oxford FNP

## 2014-02-06 ENCOUNTER — Other Ambulatory Visit: Payer: Self-pay | Admitting: Family Medicine

## 2014-06-11 ENCOUNTER — Other Ambulatory Visit: Payer: Self-pay | Admitting: Family Medicine

## 2014-06-12 NOTE — Telephone Encounter (Signed)
Pt has been here in last year but no labs

## 2015-02-10 ENCOUNTER — Other Ambulatory Visit: Payer: Self-pay | Admitting: Family Medicine

## 2015-02-19 ENCOUNTER — Encounter (INDEPENDENT_AMBULATORY_CARE_PROVIDER_SITE_OTHER): Payer: Self-pay

## 2015-02-19 ENCOUNTER — Ambulatory Visit (INDEPENDENT_AMBULATORY_CARE_PROVIDER_SITE_OTHER): Payer: BC Managed Care – PPO | Admitting: Family

## 2015-02-19 ENCOUNTER — Encounter: Payer: Self-pay | Admitting: Family

## 2015-02-19 VITALS — BP 114/69 | HR 56 | Temp 97.0°F | Ht 71.0 in | Wt 188.2 lb

## 2015-02-19 DIAGNOSIS — K219 Gastro-esophageal reflux disease without esophagitis: Secondary | ICD-10-CM | POA: Diagnosis not present

## 2015-02-19 DIAGNOSIS — Z0000 Encounter for general adult medical examination without abnormal findings: Secondary | ICD-10-CM

## 2015-02-19 DIAGNOSIS — E785 Hyperlipidemia, unspecified: Secondary | ICD-10-CM

## 2015-02-19 DIAGNOSIS — Z8679 Personal history of other diseases of the circulatory system: Secondary | ICD-10-CM | POA: Diagnosis not present

## 2015-02-19 DIAGNOSIS — Z125 Encounter for screening for malignant neoplasm of prostate: Secondary | ICD-10-CM | POA: Diagnosis not present

## 2015-02-19 MED ORDER — ASPIRIN-DIPYRIDAMOLE ER 25-200 MG PO CP12: ORAL_CAPSULE | ORAL | Status: DC

## 2015-02-19 MED ORDER — OMEPRAZOLE 40 MG PO CPDR: DELAYED_RELEASE_CAPSULE | ORAL | Status: DC

## 2015-02-19 MED ORDER — ATORVASTATIN CALCIUM 40 MG PO TABS: 40.0000 mg | ORAL_TABLET | Freq: Every day | ORAL | Status: DC

## 2015-02-19 NOTE — Progress Notes (Signed)
   Subjective:    Patient ID: Brian Briggs, male    DOB: [birth year], 54 y.o.   MRN: [id number]  Hyperlipidemia This is a chronic problem. The current episode started more than 1 year ago. The problem is controlled. Recent lipid tests were reviewed and are normal. He has no history of diabetes or hypothyroidism. Pertinent negatives include no leg pain or shortness of breath. Current antihyperlipidemic treatment includes statins. The current treatment provides significant improvement of lipids. Risk factors for coronary artery disease include dyslipidemia, family history and male sex.      Review of Systems  Constitutional: Negative.   HENT: Negative.   Respiratory: Negative.  Negative for shortness of breath.   Cardiovascular: Negative.   Gastrointestinal: Negative.   Endocrine: Negative.   Genitourinary: Negative.   Musculoskeletal: Negative.   Neurological: Negative.   Hematological: Negative.   Psychiatric/Behavioral: Negative.   All other systems reviewed and are negative.      Objective:   Physical Exam  Constitutional: He is oriented to person, place, and time. He appears well-developed and well-nourished. No distress.  HENT:  Head: Normocephalic.  Right Ear: External ear normal.  Left Ear: External ear normal.  Nose: Nose normal.  Mouth/Throat: Oropharynx is clear and moist.  Eyes: Pupils are equal, round, and reactive to light. Right eye exhibits no discharge. Left eye exhibits no discharge.  Neck: Normal range of motion. Neck supple. No thyromegaly present.  Cardiovascular: Normal rate, regular rhythm, normal heart sounds and intact distal pulses.   No murmur heard. Pulmonary/Chest: Effort normal and breath sounds normal. No respiratory distress. He has no wheezes.  Abdominal: Soft. Bowel sounds are normal. He exhibits no distension. There is no tenderness.  Musculoskeletal: Normal range of motion. He exhibits no edema or tenderness.  Neurological: He is alert and  oriented to person, place, and time. He has normal reflexes. No cranial nerve deficit.  Skin: Skin is warm and dry. No rash noted. No erythema.  Psychiatric: He has a normal mood and affect. His behavior is normal. Judgment and thought content normal.  Vitals reviewed.   BP 114/69 mmHg  Pulse 56  Temp(Src) 97 F (36.1 C) (Oral)  Ht $R'5\' 11"'eQ$  (1.803 m)  Wt 188 lb 3.2 oz (85.367 kg)  BMI 26.26 kg/m2       Assessment & Plan:  1. Gastroesophageal reflux disease, esophagitis presence not specified - CMP14+EGFR  2. History of cardiovascular disorder - CMP14+EGFR - Lipid panel  3. Hyperlipidemia - CMP14+EGFR - Lipid panel - atorvastatin (LIPITOR) 40 MG tablet; Take 1 tablet (40 mg total) by mouth daily at 6 PM.  Dispense: 90 tablet; Refill: 3  4. Physical exam - CMP14+EGFR - Lipid panel - PSA, total and free - Vit D  25 hydroxy (rtn osteoporosis monitoring)  5. Prostate cancer screening - PSA, total and free   Continue all meds Labs pending Health Maintenance reviewed Diet and exercise encouraged RTO 6  months  Evelina Dun, FNP

## 2015-02-19 NOTE — Addendum Note (Signed)
Addended by: Prescott GumLAND, Micki Cassel M on: 02/19/2015 08:49 AM   Modules accepted: Kipp BroodSmartSet

## 2015-02-19 NOTE — Patient Instructions (Signed)

## 2015-02-20 ENCOUNTER — Telehealth: Payer: Self-pay | Admitting: *Deleted

## 2015-02-20 LAB — CMP14+EGFR
A/G RATIO: 2.1 (ref 1.1–2.5)
ALK PHOS: 71 IU/L (ref 39–117)
ALT: 11 IU/L (ref 0–44)
AST: 22 IU/L (ref 0–40)
Albumin: 4.4 g/dL (ref 3.5–5.5)
BILIRUBIN TOTAL: 0.7 mg/dL (ref 0.0–1.2)
BUN / CREAT RATIO: 11 (ref 9–20)
BUN: 12 mg/dL (ref 6–24)
CHLORIDE: 101 mmol/L (ref 97–108)
CO2: 25 mmol/L (ref 18–29)
Calcium: 9.2 mg/dL (ref 8.7–10.2)
Creatinine, Ser: 1.11 mg/dL (ref 0.76–1.27)
GFR calc Af Amer: 85 mL/min/{1.73_m2} (ref >59)
GFR, EST NON AFRICAN AMERICAN: 74 mL/min/{1.73_m2} (ref >59)
Globulin, Total: 2.1 g/dL (ref 1.5–4.5)
Glucose: 99 mg/dL (ref 65–99)
Potassium: 4.8 mmol/L (ref 3.5–5.2)
SODIUM: 141 mmol/L (ref 134–144)
TOTAL PROTEIN: 6.5 g/dL (ref 6.0–8.5)

## 2015-02-20 LAB — LIPID PANEL
CHOLESTEROL TOTAL: 129 mg/dL (ref 100–199)
Chol/HDL Ratio: 2.7 ratio units (ref 0.0–5.0)
HDL: 47 mg/dL (ref >39)
LDL CALC: 68 mg/dL (ref 0–99)
TRIGLYCERIDES: 68 mg/dL (ref 0–149)
VLDL CHOLESTEROL CAL: 14 mg/dL (ref 5–40)

## 2015-02-20 LAB — VITAMIN D 25 HYDROXY (VIT D DEFICIENCY, FRACTURES): Vit D, 25-Hydroxy: 35.5 ng/mL (ref 30.0–100.0)

## 2015-02-20 LAB — PSA, TOTAL AND FREE
PSA FREE PCT: 66 %
PSA, Free: 0.33 ng/mL
PSA: 0.5 ng/mL (ref 0.0–4.0)

## 2015-02-20 NOTE — Telephone Encounter (Signed)
-----   Message from Junie Spencerhristy A Hawks, FNP sent at 02/20/2015  9:02 AM EDT ----- Kidney and liver function stable Cholesterol levels WNL PSA levels WNL Vit D levels WNL

## 2015-06-18 ENCOUNTER — Other Ambulatory Visit: Payer: Self-pay | Admitting: Family Medicine

## 2015-08-20 ENCOUNTER — Other Ambulatory Visit: Payer: Self-pay | Admitting: Family Medicine

## 2015-08-21 ENCOUNTER — Encounter: Payer: Self-pay | Admitting: Family

## 2015-08-21 ENCOUNTER — Ambulatory Visit (INDEPENDENT_AMBULATORY_CARE_PROVIDER_SITE_OTHER): Payer: BC Managed Care – PPO | Admitting: Family

## 2015-08-21 VITALS — BP 111/66 | HR 54 | Temp 97.7°F | Wt 185.2 lb

## 2015-08-21 DIAGNOSIS — Z23 Encounter for immunization: Secondary | ICD-10-CM | POA: Diagnosis not present

## 2015-08-21 DIAGNOSIS — K219 Gastro-esophageal reflux disease without esophagitis: Secondary | ICD-10-CM | POA: Diagnosis not present

## 2015-08-21 DIAGNOSIS — E785 Hyperlipidemia, unspecified: Secondary | ICD-10-CM

## 2015-08-21 DIAGNOSIS — Z8679 Personal history of other diseases of the circulatory system: Secondary | ICD-10-CM

## 2015-08-21 DIAGNOSIS — Z8673 Personal history of transient ischemic attack (TIA), and cerebral infarction without residual deficits: Secondary | ICD-10-CM

## 2015-08-21 DIAGNOSIS — J309 Allergic rhinitis, unspecified: Secondary | ICD-10-CM

## 2015-08-21 MED ORDER — OMEPRAZOLE 40 MG PO CPDR: DELAYED_RELEASE_CAPSULE | ORAL | Status: DC

## 2015-08-21 MED ORDER — ASPIRIN-DIPYRIDAMOLE ER 25-200 MG PO CP12: ORAL_CAPSULE | ORAL | Status: DC

## 2015-08-21 MED ORDER — ATORVASTATIN CALCIUM 40 MG PO TABS: 40.0000 mg | ORAL_TABLET | Freq: Every day | ORAL | Status: DC

## 2015-08-21 MED ORDER — FLUTICASONE PROPIONATE 50 MCG/ACT NA SUSP: 2.0000 | Freq: Every day | NASAL | Status: DC

## 2015-08-21 NOTE — Patient Instructions (Signed)
Allergic Rhinitis Allergic rhinitis is when the mucous membranes in the nose respond to allergens. Allergens are particles in the air that cause your body to have an allergic reaction. This causes you to release allergic antibodies. Through a chain of events, these eventually cause you to release histamine into the blood stream. Although meant to protect the body, it is this release of histamine that causes your discomfort, such as frequent sneezing, congestion, and an itchy, runny nose.  CAUSES Seasonal allergic rhinitis (hay fever) is caused by pollen allergens that may come from grasses, trees, and weeds. Year-round allergic rhinitis (perennial allergic rhinitis) is caused by allergens such as house dust mites, pet dander, and mold spores. SYMPTOMS  Nasal stuffiness (congestion).  Itchy, runny nose with sneezing and tearing of the eyes. DIAGNOSIS Your health care provider can help you determine the allergen or allergens that trigger your symptoms. If you and your health care provider are unable to determine the allergen, skin or blood testing may be used. Your health care provider will diagnose your condition after taking your health history and performing a physical exam. Your health care provider may assess you for other related conditions, such as asthma, pink eye, or an ear infection. TREATMENT Allergic rhinitis does not have a cure, but it can be controlled by:  Medicines that block allergy symptoms. These may include allergy shots, nasal sprays, and oral antihistamines.  Avoiding the allergen. Hay fever may often be treated with antihistamines in pill or nasal spray forms. Antihistamines block the effects of histamine. There are over-the-counter medicines that may help with nasal congestion and swelling around the eyes. Check with your health care provider before taking or giving this medicine. If avoiding the allergen or the medicine prescribed do not work, there are many new medicines  your health care provider can prescribe. Stronger medicine may be used if initial measures are ineffective. Desensitizing injections can be used if medicine and avoidance does not work. Desensitization is when a patient is given ongoing shots until the body becomes less sensitive to the allergen. Make sure you follow up with your health care provider if problems continue. HOME CARE INSTRUCTIONS It is not possible to completely avoid allergens, but you can reduce your symptoms by taking steps to limit your exposure to them. It helps to know exactly what you are allergic to so that you can avoid your specific triggers. SEEK MEDICAL CARE IF:  You have a fever.  You develop a cough that does not stop easily (persistent).  You have shortness of breath.  You start wheezing.  Symptoms interfere with normal daily activities.   This information is not intended to replace advice given to you by your health care provider. Make sure you discuss any questions you have with your health care provider.   Document Released: 07/26/2001 Document Revised: 11/21/2014 Document Reviewed: 07/08/2013 Elsevier Interactive Patient Education 2016 [employer]. Health Maintenance, Male A healthy lifestyle and preventative care can promote health and wellness.  Maintain regular health, dental, and eye exams.  Eat a healthy diet. Foods like vegetables, fruits, whole grains, low-fat dairy products, and lean protein foods contain the nutrients you need and are low in calories. Decrease your intake of foods high in solid fats, added sugars, and salt. Get information about a proper diet from your health care provider, if necessary.  Regular physical exercise is one of the most important things you can do for your health. Most adults should get at least 150 minutes of moderate-intensity  exercise (any activity that increases your heart rate and causes you to sweat) each week. In addition, most adults need muscle-strengthening  exercises on 2 or more days a week.   Maintain a healthy weight. The body mass index (BMI) is a screening tool to identify possible weight problems. It provides an estimate of body fat based on height and weight. Your health care provider can find your BMI and can help you achieve or maintain a healthy weight. For males 20 years and older:  A BMI below 18.5 is considered underweight.  A BMI of 18.5 to 24.9 is normal.  A BMI of 25 to 29.9 is considered overweight.  A BMI of 30 and above is considered obese.  Maintain normal blood lipids and cholesterol by exercising and minimizing your intake of saturated fat. Eat a balanced diet with plenty of fruits and vegetables. Blood tests for lipids and cholesterol should begin at age 39 and be repeated every 5 years. If your lipid or cholesterol levels are high, you are over age 8, or you are at high risk for heart disease, you may need your cholesterol levels checked more frequently.Ongoing high lipid and cholesterol levels should be treated with medicines if diet and exercise are not working.  If you smoke, find out from your health care provider how to quit. If you do not use tobacco, do not start.  Lung cancer screening is recommended for adults aged 55-80 years who are at high risk for developing lung cancer because of a history of smoking. A yearly low-dose CT scan of the lungs is recommended for people who have at least a 30-pack-year history of smoking and are current smokers or have quit within the past 15 years. A pack year of smoking is smoking an average of 1 pack of cigarettes a day for 1 year (for example, a 30-pack-year history of smoking could mean smoking 1 pack a day for 30 years or 2 packs a day for 15 years). Yearly screening should continue until the smoker has stopped smoking for at least 15 years. Yearly screening should be stopped for people who develop a health problem that would prevent them from having lung cancer treatment.  If  you choose to drink alcohol, do not have more than 2 drinks per day. One drink is considered to be 12 oz (360 mL) of beer, 5 oz (150 mL) of wine, or 1.5 oz (45 mL) of liquor.  Avoid the use of street drugs. Do not share needles with anyone. Ask for help if you need support or instructions about stopping the use of drugs.  High blood pressure causes heart disease and increases the risk of stroke. High blood pressure is more likely to develop in:  People who have blood pressure in the end of the normal range (100-139/85-89 mm Hg).  People who are overweight or obese.  People who are African American.  If you are 76-73 years of age, have your blood pressure checked every 3-5 years. If you are 34 years of age or older, have your blood pressure checked every year. You should have your blood pressure measured twice--once when you are at a hospital or clinic, and once when you are not at a hospital or clinic. Record the average of the two measurements. To check your blood pressure when you are not at a hospital or clinic, you can use:  An automated blood pressure machine at a pharmacy.  A home blood pressure monitor.  If you are 45-79  years old, ask your health care provider if you should take aspirin to prevent heart disease.  Diabetes screening involves taking a blood sample to check your fasting blood sugar level. This should be done once every 3 years after age 56 if you are at a normal weight and without risk factors for diabetes. Testing should be considered at a younger age or be carried out more frequently if you are overweight and have at least 1 risk factor for diabetes.  Colorectal cancer can be detected and often prevented. Most routine colorectal cancer screening begins at the age of 56 and continues through age 56. However, your health care provider may recommend screening at an earlier age if you have risk factors for colon cancer. On a yearly basis, your health care provider may  provide home test kits to check for hidden blood in the stool. A small camera at the end of a tube may be used to directly examine the colon (sigmoidoscopy or colonoscopy) to detect the earliest forms of colorectal cancer. Talk to your health care provider about this at age 56 when routine screening begins. A direct exam of the colon should be repeated every 5-10 years through age 56, unless early forms of precancerous polyps or small growths are found.  People who are at an increased risk for hepatitis B should be screened for this virus. You are considered at high risk for hepatitis B if:  You were born in a country where hepatitis B occurs often. Talk with your health care provider about which countries are considered high risk.  Your parents were born in a high-risk country and you have not received a shot to protect against hepatitis B (hepatitis B vaccine).  You have HIV or AIDS.  You use needles to inject street drugs.  You live with, or have sex with, someone who has hepatitis B.  You are a man who has sex with other men (MSM).  You get hemodialysis treatment.  You take certain medicines for conditions like cancer, organ transplantation, and autoimmune conditions.  Hepatitis C blood testing is recommended for all people born from [id number] through 1965 and any individual with known risk factors for hepatitis C.  Healthy men should no longer receive prostate-specific antigen (PSA) blood tests as part of routine cancer screening. Talk to your health care provider about prostate cancer screening.  Testicular cancer screening is not recommended for adolescents or adult males who have no symptoms. Screening includes self-exam, a health care provider exam, and other screening tests. Consult with your health care provider about any symptoms you have or any concerns you have about testicular cancer.  Practice safe sex. Use condoms and avoid high-risk sexual practices to reduce the spread of  sexually transmitted infections (STIs).  You should be screened for STIs, including gonorrhea and chlamydia if:  You are sexually active and are younger than 24 years.  You are older than 24 years, and your health care provider tells you that you are at risk for this type of infection.  Your sexual activity has changed since you were last screened, and you are at an increased risk for chlamydia or gonorrhea. Ask your health care provider if you are at risk.  If you are at risk of being infected with HIV, it is recommended that you take a prescription medicine daily to prevent HIV infection. This is called pre-exposure prophylaxis (PrEP). You are considered at risk if:  You are a man who has sex with other men (  MSM).  You are a heterosexual man who is sexually active with multiple partners.  You take drugs by injection.  You are sexually active with a partner who has HIV.  Talk with your health care provider about whether you are at high risk of being infected with HIV. If you choose to begin PrEP, you should first be tested for HIV. You should then be tested every 3 months for as long as you are taking PrEP.  Use sunscreen. Apply sunscreen liberally and repeatedly throughout the day. You should seek shade when your shadow is shorter than you. Protect yourself by wearing long sleeves, pants, a wide-brimmed hat, and sunglasses year round whenever you are outdoors.  Tell your health care provider of new moles or changes in moles, especially if there is a change in shape or color. Also, tell your health care provider if a mole is larger than the size of a pencil eraser.  A one-time screening for abdominal aortic aneurysm (AAA) and surgical repair of large AAAs by ultrasound is recommended for men aged 65-75 years who are current or former smokers.  Stay current with your vaccines (immunizations).   This information is not intended to replace advice given to you by your health care provider.  Make sure you discuss any questions you have with your health care provider.   Document Released: 04/28/2008 Document Revised: 11/21/2014 Document Reviewed: 03/28/2011 Elsevier Interactive Patient Education [employer]

## 2015-08-21 NOTE — Progress Notes (Signed)
Subjective:    Patient ID: Brian Briggs, male    DOB: 29-Dec-1960, 54 y.o.   MRN: [id number]  Pt presents to the office today for chronic follow up.  Gastrophageal Reflux He reports no belching, no coughing, no heartburn or no sore throat. This is a chronic problem. The current episode started more than 1 year ago. The problem occurs rarely. The problem has been waxing and waning. The symptoms are aggravated by certain foods. He has tried a PPI for the symptoms. The treatment provided significant relief.  Hyperlipidemia This is a chronic problem. The current episode started more than 1 year ago. The problem is controlled. Recent lipid tests were reviewed and are normal. He has no history of diabetes or hypothyroidism. Pertinent negatives include no leg pain or shortness of breath. Current antihyperlipidemic treatment includes statins. The current treatment provides significant improvement of lipids. Risk factors for coronary artery disease include dyslipidemia, family history and male sex.  Ear Fullness  There is pain in the left ear. This is a recurrent problem. The current episode started 1 to 4 weeks ago. The problem occurs constantly. The problem has been waxing and waning. There has been no fever. The pain is at a severity of 3/10. The pain is mild. Associated symptoms include headaches. Pertinent negatives include no coughing, ear discharge, hearing loss or sore throat. He has tried acetaminophen and cold packs for the symptoms. The treatment provided mild relief. There is no history of a chronic ear infection.  History of CVA Pt had a CVA in 2009. Pt currently taking aggrenox and Lipitor daily with no complaints.     Review of Systems  Constitutional: Negative.   HENT: Negative for ear discharge, hearing loss and sore throat.   Respiratory: Negative.  Negative for cough and shortness of breath.   Cardiovascular: Negative.   Gastrointestinal: Negative.  Negative for heartburn.    Endocrine: Negative.   Genitourinary: Negative.   Musculoskeletal: Negative.   Neurological: Positive for headaches.  Hematological: Negative.   Psychiatric/Behavioral: Negative.   All other systems reviewed and are negative.      Objective:   Physical Exam  Constitutional: He is oriented to person, place, and time. He appears well-developed and well-nourished. No distress.  HENT:  Head: Normocephalic.  Right Ear: External ear normal.  Left Ear: A middle ear effusion is present.  Nose: Nose normal.  Mouth/Throat: Oropharynx is clear and moist.  Eyes: Pupils are equal, round, and reactive to light. Right eye exhibits no discharge. Left eye exhibits no discharge.  Neck: Normal range of motion. Neck supple. No thyromegaly present.  Cardiovascular: Normal rate, regular rhythm, normal heart sounds and intact distal pulses.   No murmur heard. Pulmonary/Chest: Effort normal and breath sounds normal. No respiratory distress. He has no wheezes.  Abdominal: Soft. Bowel sounds are normal. He exhibits no distension. There is no tenderness.  Musculoskeletal: Normal range of motion. He exhibits no edema or tenderness.  Neurological: He is alert and oriented to person, place, and time. He has normal reflexes. No cranial nerve deficit.  Skin: Skin is warm and dry. No rash noted. No erythema.  Psychiatric: He has a normal mood and affect. His behavior is normal. Judgment and thought content normal.  Vitals reviewed.     BP 111/66 mmHg  Pulse 54  Temp(Src) 97.7 F (36.5 C) (Oral)  Wt 185 lb 3.2 oz (84.006 kg)     Assessment & Plan:  1. Gastroesophageal reflux disease, esophagitis presence not  specified - CMP14+EGFR - omeprazole (PRILOSEC) 40 MG capsule; TAKE (1) CAPSULE DAILY  Dispense: 90 capsule; Refill: 3  2. Hyperlipidemia - CMP14+EGFR - Lipid panel - atorvastatin (LIPITOR) 40 MG tablet; Take 1 tablet (40 mg total) by mouth daily at 6 PM.  Dispense: 90 tablet; Refill: 2  3.  Allergic rhinitis, unspecified allergic rhinitis type - CMP14+EGFR - fluticasone (FLONASE) 50 MCG/ACT nasal spray; Place 2 sprays into both nostrils daily.  Dispense: 16 g; Refill: 6  4. History of cardiovascular disorder - CMP14+EGFR - dipyridamole-aspirin (AGGRENOX) 200-25 MG 12hr capsule; TAKE  (1)  CAPSULE  TWICE DAILY.  Dispense: 180 capsule; Refill: 3  5. History of CVA (cerebrovascular accident) - CMP14+EGFR - dipyridamole-aspirin (AGGRENOX) 200-25 MG 12hr capsule; TAKE  (1)  CAPSULE  TWICE DAILY.  Dispense: 180 capsule; Refill: 3   Continue all meds Labs pending Health Maintenance reviewed- Flu shot given today Diet and exercise encouraged RTO 6 months  Evelina Dun, FNP

## 2015-08-22 LAB — CMP14+EGFR
ALK PHOS: 71 IU/L (ref 39–117)
ALT: 14 IU/L (ref 0–44)
AST: 28 IU/L (ref 0–40)
Albumin/Globulin Ratio: 1.9 (ref 1.1–2.5)
Albumin: 4.5 g/dL (ref 3.5–5.5)
BILIRUBIN TOTAL: 0.8 mg/dL (ref 0.0–1.2)
BUN/Creatinine Ratio: 17 (ref 9–20)
BUN: 14 mg/dL (ref 6–24)
CHLORIDE: 101 mmol/L (ref 97–108)
CO2: 25 mmol/L (ref 18–29)
CREATININE: 0.82 mg/dL (ref 0.76–1.27)
Calcium: 9.1 mg/dL (ref 8.7–10.2)
GFR calc Af Amer: 114 mL/min/{1.73_m2} (ref >59)
GFR calc non Af Amer: 99 mL/min/{1.73_m2} (ref >59)
GLOBULIN, TOTAL: 2.4 g/dL (ref 1.5–4.5)
GLUCOSE: 102 mg/dL — AB (ref 65–99)
Potassium: 4.6 mmol/L (ref 3.5–5.2)
SODIUM: 141 mmol/L (ref 134–144)
Total Protein: 6.9 g/dL (ref 6.0–8.5)

## 2015-08-22 LAB — LIPID PANEL
CHOLESTEROL TOTAL: 139 mg/dL (ref 100–199)
Chol/HDL Ratio: 2.8 ratio units (ref 0.0–5.0)
HDL: 49 mg/dL (ref >39)
LDL CALC: 74 mg/dL (ref 0–99)
TRIGLYCERIDES: 81 mg/dL (ref 0–149)
VLDL Cholesterol Cal: 16 mg/dL (ref 5–40)

## 2015-08-25 ENCOUNTER — Ambulatory Visit (INDEPENDENT_AMBULATORY_CARE_PROVIDER_SITE_OTHER): Payer: BC Managed Care – PPO | Admitting: Family

## 2015-08-25 ENCOUNTER — Encounter: Payer: Self-pay | Admitting: Family

## 2015-08-25 VITALS — BP 116/68 | HR 73 | Temp 97.5°F | Ht 71.0 in | Wt 186.2 lb

## 2015-08-25 DIAGNOSIS — R519 Headache, unspecified: Secondary | ICD-10-CM

## 2015-08-25 DIAGNOSIS — R51 Headache: Secondary | ICD-10-CM

## 2015-08-25 DIAGNOSIS — R5383 Other fatigue: Secondary | ICD-10-CM | POA: Diagnosis not present

## 2015-08-25 DIAGNOSIS — W57XXXA Bitten or stung by nonvenomous insect and other nonvenomous arthropods, initial encounter: Secondary | ICD-10-CM

## 2015-08-25 NOTE — Patient Instructions (Signed)
[place] Spotted Fever [place] spotted fever is an illness that is spread to people by infected ticks. The illness causes flulike symptoms and a reddish-purple rash. This illness can quickly become very serious. Treatment must be started right away. When the illness is not treated right away, it can sometimes lead to long-term health problems or even death. This illness is most common during warm weather when ticks are most active. CAUSES [place] spotted fever is caused by a type of bacteria that is called Rickettsia rickettsii. This type of bacteria is carried by American dog ticks and [place] wood ticks. People get infected through a bite from a tick that is infected with the bacteria. The bite is painless, and it frequently goes unnoticed. The bacteria can also infect a person when tick blood or tick feces get into a person's body through damaged skin. A tick bite is not necessary for an infection to occur. People can get [place] spotted fever if they get a tick's blood or body fluids on their skin in the area of a small cut or sore. This could happen while removing a tick from another person or a dog. The infection is not contagious, and it cannot be spread (transmitted) from person to person. SIGNS AND SYMPTOMS Symptoms may begin 2-14 days after a tick bite. The most common early symptoms are:  Fever.  Muscle aches.  Headache.  Nausea.  Vomiting.  Poor appetite.  Abdominal pain. The reddish-purple rash usually appears 3-5 days after the first symptoms begin. The rash often starts on the wrists and ankles. It may then spread to the palms, the soles of the feet, the legs, and the trunk. DIAGNOSIS Diagnosis is based on a physical exam, medical history, and blood tests. Your health care provider may suspect [place] spotted fever in one of these cases:   If you have recently been bitten by a tick.  If you have been in areas that have a lot of ticks  or in areas where the disease is common. TREATMENT It is important to begin treatment right away. Treatment will usually involve the use of antibiotic medicines. In some cases, your health care provider may begin treatment before the diagnosis is confirmed. If your symptoms are severe, a hospital stay may be needed. HOME CARE INSTRUCTIONS  Rest as much as possible until you feel better.  Take medicines only as directed by your health care provider.  Take your antibiotic medicine as directed by your health care provider. Finish the antibiotic even if you start to feel better.  Drink enough fluid to keep your urine clear or pale yellow.  Keep all follow-up visits as directed by your health care provider. This is important. PREVENTION Avoiding tick bites can help to prevent this illness. Take these steps to avoid tick bites when you are outdoors:  Be aware that most ticks live in shrubs, low tree branches, and grassy areas. A tick can climb onto your body when you make contact with leaves or grass where the tick is waiting.  Wear protective clothing. Long sleeves and long pants are best.  Wear white clothes so you can see ticks more easily.  Tuck your pant legs into your socks.  If you go walking on a trail, stay in the middle of the trail to avoid brushing against bushes.  Avoid walking through areas that have long grass.  Put insect repellent on all exposed skin and along boot tops, pant legs, and sleeve cuffs.    Check clothing, hair, and skin repeatedly and before going inside.  Check family members and pets for ticks.  Brush off any ticks that are not attached.  Take a shower or a bath as soon as possible after you have been outdoors. Check your skin for ticks. The most common places on the body where ticks attach themselves are the scalp, neck, armpits, waist, and groin. You can also greatly reduce your chances of getting [place] spotted fever if you remove attached  ticks as soon as possible. To remove an attached tick, use a forceps or fine-point tweezers to detach the intact tick without leaving its mouth parts in the skin. The wound from the tick bite should be washed after the tick has been removed. SEEK MEDICAL CARE IF:  You have drainage, swelling, or increased redness or pain in the area of the rash. SEEK IMMEDIATE MEDICAL CARE IF:  You have chest pain.  You have shortness of breath.  You have a severe headache.  You have a seizure.  You have severe abdominal pain.  You are feeling confused.  You are bruising easily.  You have bleeding from your gums.  You have blood in your stool.   This information is not intended to replace advice given to you by your health care provider. Make sure you discuss any questions you have with your health care provider.   Document Released: 02/12/2001 Document Revised: 11/21/2014 Document Reviewed: 06/16/2014 Elsevier Interactive Patient Education 2016 [employer]  

## 2015-08-25 NOTE — Progress Notes (Signed)
   Subjective:    Patient ID: Brian Briggs, male    DOB: July 17, 1961, 54 y.o.   MRN: [id number]  HPI Pt presents to the office today with complaints of fatiuge, headache, dizziness. Pt states his father was diagnosed with [place] Spotted Fever this week. Pt states he helped his father drag a deer out of the wood two weeks ago. Pt states he did not find a tick on him, but has the same symptoms as his father. Pt denies any cough, palpitations, SOB, or edema at this time.     Review of Systems  Constitutional: Negative.   HENT: Negative.   Respiratory: Negative.   Cardiovascular: Negative.   Gastrointestinal: Negative.   Endocrine: Negative.   Genitourinary: Negative.   Musculoskeletal: Negative.   Neurological: Negative.   Hematological: Negative.   Psychiatric/Behavioral: Negative.   All other systems reviewed and are negative.      Objective:   Physical Exam  Constitutional: He is oriented to person, place, and time. He appears well-developed and well-nourished. No distress.  HENT:  Head: Normocephalic.  Right Ear: External ear normal.  Left Ear: External ear normal.  Nasal passage erythemas and bloody with mild swelling Oropharynx erythemas     Eyes: Pupils are equal, round, and reactive to light. Right eye exhibits no discharge. Left eye exhibits no discharge.  Neck: Normal range of motion. Neck supple. No thyromegaly present.  Cardiovascular: Normal rate, regular rhythm, normal heart sounds and intact distal pulses.   No murmur heard. Pulmonary/Chest: Effort normal and breath sounds normal. No respiratory distress. He has no wheezes.  Abdominal: Soft. Bowel sounds are normal. He exhibits no distension. There is no tenderness.  Musculoskeletal: Normal range of motion. He exhibits no edema or tenderness.  Neurological: He is alert and oriented to person, place, and time. He has normal reflexes. No cranial nerve deficit.  Skin: Skin is warm and dry. No rash noted. No  erythema.  Psychiatric: He has a normal mood and affect. His behavior is normal. Judgment and thought content normal.  Vitals reviewed.     BP 116/68 mmHg  Pulse 73  Temp(Src) 97.5 F (36.4 C) (Oral)  Ht  (1.803 m)  Wt 186 lb 3.2 oz (84.46 kg)  BMI 25.98 kg/m2     Assessment & Plan:  1. Tick bite - Anemia Profile B - Lyme Ab/Western Blot Reflex - [place] spotted fvr abs pnl(IgG+IgM)  2. Other fatigue - Anemia Profile B - Lyme Ab/Western Blot Reflex - [place] spotted fvr abs pnl(IgG+IgM)  3. Acute nonintractable headache, unspecified headache type - Anemia Profile B - Lyme Ab/Western Blot Reflex - [place] spotted fvr abs pnl(IgG+IgM)  -Pt to report any new fever, joint pain, or rash -Wear protective clothing while outside- Long sleeves and long pants -Put insect repellent on all exposed skin and along clothing -Take a shower as soon as possible after being outside -Labs pending -RTO prn  Jannifer Rodney, FNP

## 2015-08-27 LAB — ANEMIA PROFILE B
BASOS: 0 %
Basophils Absolute: 0 10*3/uL (ref 0.0–0.2)
EOS (ABSOLUTE): 0.1 10*3/uL (ref 0.0–0.4)
Eos: 1 %
FERRITIN: 190 ng/mL (ref 30–400)
FOLATE: 14.2 ng/mL (ref >3.0)
HEMATOCRIT: 39.4 % (ref 37.5–51.0)
HEMOGLOBIN: 14.3 g/dL (ref 12.6–17.7)
IMMATURE GRANS (ABS): 0 10*3/uL (ref 0.0–0.1)
Immature Granulocytes: 0 %
Iron Saturation: 36 % (ref 15–55)
Iron: 90 ug/dL (ref 38–169)
LYMPHS: 29 %
Lymphocytes Absolute: 1.6 10*3/uL (ref 0.7–3.1)
MCH: 31.7 pg (ref 26.6–33.0)
MCHC: 36.3 g/dL — ABNORMAL HIGH (ref 31.5–35.7)
MCV: 87 fL (ref 79–97)
MONOCYTES: 6 %
Monocytes Absolute: 0.3 10*3/uL (ref 0.1–0.9)
Neutrophils Absolute: 3.6 10*3/uL (ref 1.4–7.0)
Neutrophils: 64 %
Platelets: 189 10*3/uL (ref 150–379)
RBC: 4.51 x10E6/uL (ref 4.14–5.80)
RDW: 12.1 % — AB (ref 12.3–15.4)
Retic Ct Pct: 1.2 % (ref 0.6–2.6)
TIBC: 247 ug/dL — AB (ref 250–450)
UIBC: 157 ug/dL (ref 111–343)
VITAMIN B 12: 418 pg/mL (ref 211–946)
WBC: 5.6 10*3/uL (ref 3.4–10.8)

## 2015-08-27 LAB — ROCKY MTN SPOTTED FVR ABS PNL(IGG+IGM)
RMSF IgG: UNDETERMINED
RMSF IgM: 0.23 index (ref 0.00–0.89)

## 2015-08-27 LAB — LYME AB/WESTERN BLOT REFLEX
LYME DISEASE AB, QUANT, IGM: <0.8 index (ref 0.00–0.79)
Lyme IgG/IgM Ab: <0.91 {ISR} (ref 0.00–0.90)

## 2015-08-27 LAB — RMSF, IGG, IFA: RMSF, IGG, IFA: <1:64 {titer}

## 2015-09-21 ENCOUNTER — Encounter: Payer: Self-pay | Admitting: Gastroenterology

## 2016-02-19 ENCOUNTER — Encounter: Payer: Self-pay | Admitting: *Deleted

## 2016-02-19 ENCOUNTER — Encounter: Payer: Self-pay | Admitting: Family

## 2016-02-19 ENCOUNTER — Ambulatory Visit (INDEPENDENT_AMBULATORY_CARE_PROVIDER_SITE_OTHER): Payer: BC Managed Care – PPO | Admitting: Family

## 2016-02-19 VITALS — BP 118/77 | HR 55 | Temp 97.5°F | Ht 71.0 in | Wt 189.0 lb

## 2016-02-19 DIAGNOSIS — E785 Hyperlipidemia, unspecified: Secondary | ICD-10-CM | POA: Diagnosis not present

## 2016-02-19 DIAGNOSIS — J309 Allergic rhinitis, unspecified: Secondary | ICD-10-CM | POA: Diagnosis not present

## 2016-02-19 DIAGNOSIS — L570 Actinic keratosis: Secondary | ICD-10-CM

## 2016-02-19 DIAGNOSIS — Z8673 Personal history of transient ischemic attack (TIA), and cerebral infarction without residual deficits: Secondary | ICD-10-CM | POA: Diagnosis not present

## 2016-02-19 DIAGNOSIS — K219 Gastro-esophageal reflux disease without esophagitis: Secondary | ICD-10-CM

## 2016-02-19 LAB — CMP14+EGFR
A/G RATIO: 2.1 (ref 1.2–2.2)
ALBUMIN: 4.4 g/dL (ref 3.5–5.5)
ALK PHOS: 70 IU/L (ref 39–117)
ALT: 9 IU/L (ref 0–44)
AST: 23 IU/L (ref 0–40)
BUN / CREAT RATIO: 19 (ref 9–20)
BUN: 16 mg/dL (ref 6–24)
Bilirubin Total: 0.6 mg/dL (ref 0.0–1.2)
CO2: 24 mmol/L (ref 18–29)
CREATININE: 0.86 mg/dL (ref 0.76–1.27)
Calcium: 9.2 mg/dL (ref 8.7–10.2)
Chloride: 101 mmol/L (ref 96–106)
GFR calc Af Amer: 111 mL/min/{1.73_m2} (ref >59)
GFR, EST NON AFRICAN AMERICAN: 96 mL/min/{1.73_m2} (ref >59)
GLOBULIN, TOTAL: 2.1 g/dL (ref 1.5–4.5)
Glucose: 105 mg/dL — ABNORMAL HIGH (ref 65–99)
POTASSIUM: 4.1 mmol/L (ref 3.5–5.2)
SODIUM: 141 mmol/L (ref 134–144)
Total Protein: 6.5 g/dL (ref 6.0–8.5)

## 2016-02-19 LAB — LIPID PANEL
CHOL/HDL RATIO: 2.1 ratio (ref 0.0–5.0)
CHOLESTEROL TOTAL: 120 mg/dL (ref 100–199)
HDL: 58 mg/dL (ref >39)
LDL CALC: 51 mg/dL (ref 0–99)
Triglycerides: 54 mg/dL (ref 0–149)
VLDL Cholesterol Cal: 11 mg/dL (ref 5–40)

## 2016-02-19 NOTE — Progress Notes (Signed)
   Subjective:    Patient ID: Brian Briggs, male    DOB: Mar 05, 1961, 55 y.o.   MRN: [id number]  Pt presents to the office today for chronic follow up.  Gastroesophageal Reflux He reports no belching or no heartburn. This is a chronic problem. The current episode started more than 1 year ago. The problem occurs rarely. The problem has been waxing and waning. The symptoms are aggravated by certain foods. He has tried a PPI for the symptoms. The treatment provided significant relief.  Hyperlipidemia This is a chronic problem. The current episode started more than 1 year ago. The problem is controlled. Recent lipid tests were reviewed and are normal. He has no history of diabetes or hypothyroidism. Pertinent negatives include no leg pain or shortness of breath. Current antihyperlipidemic treatment includes statins. The current treatment provides significant improvement of lipids. Risk factors for coronary artery disease include dyslipidemia, family history and male sex.  History of CVA Pt had a CVA in 2009. Pt currently taking aggrenox and Lipitor daily with no complaints.     Review of Systems  Constitutional: Negative.   Respiratory: Negative.  Negative for shortness of breath.   Cardiovascular: Negative.   Gastrointestinal: Negative.  Negative for heartburn.  Endocrine: Negative.   Genitourinary: Negative.   Musculoskeletal: Negative.   Hematological: Negative.   Psychiatric/Behavioral: Negative.   All other systems reviewed and are negative.      Objective:   Physical Exam  Constitutional: He is oriented to person, place, and time. He appears well-developed and well-nourished. No distress.  HENT:  Head: Normocephalic.  Right Ear: External ear normal.  Left Ear: External ear normal.  Nose: Nose normal.  Mouth/Throat: Oropharynx is clear and moist.  Eyes: Pupils are equal, round, and reactive to light. Right eye exhibits no discharge. Left eye exhibits no discharge.  Neck:  Normal range of motion. Neck supple. No thyromegaly present.  Cardiovascular: Normal rate, regular rhythm, normal heart sounds and intact distal pulses.   No murmur heard. Pulmonary/Chest: Effort normal and breath sounds normal. No respiratory distress. He has no wheezes.  Abdominal: Soft. Bowel sounds are normal. He exhibits no distension. There is no tenderness.  Musculoskeletal: Normal range of motion. He exhibits no edema or tenderness.  Neurological: He is alert and oriented to person, place, and time. He has normal reflexes. No cranial nerve deficit.  Skin: Skin is warm and dry. No rash noted. No erythema.  Actinic keratosis lesion present on nose-yellow crust lesion  Psychiatric: He has a normal mood and affect. His behavior is normal. Judgment and thought content normal.  Vitals reviewed.     BP 118/77 mmHg  Pulse 55  Temp(Src) 97.5 F (36.4 C) (Oral)  Ht _0  (1.803 m)  Wt 189 lb (85.73 kg)  BMI 26.37 kg/m2     Assessment & Plan:  1. Hyperlipidemia - CMP14+EGFR - Lipid panel  2. Gastroesophageal reflux disease, esophagitis presence not specified - CMP14+EGFR  3. Allergic rhinitis, unspecified allergic rhinitis type - CMP14+EGFR  4. History of CVA (cerebrovascular accident) - CMP14+EGFR  5. Actinic keratosis - Ambulatory referral to Dermatology   Continue all meds Labs pending Health Maintenance reviewed Diet and exercise encouraged RTO 6 months  Evelina Dun, FNP

## 2016-02-19 NOTE — Patient Instructions (Addendum)
Health Maintenance, Male A healthy lifestyle and preventative care can promote health and wellness.  Maintain regular health, dental, and eye exams.  Eat a healthy diet. Foods like vegetables, fruits, whole grains, low-fat dairy products, and lean protein foods contain the nutrients you need and are low in calories. Decrease your intake of foods high in solid fats, added sugars, and salt. Get information about a proper diet from your health care provider, if necessary.  Regular physical exercise is one of the most important things you can do for your health. Most adults should get at least 150 minutes of moderate-intensity exercise (any activity that increases your heart rate and causes you to sweat) each week. In addition, most adults need muscle-strengthening exercises on 2 or more days a week.   Maintain a healthy weight. The body mass index (BMI) is a screening tool to identify possible weight problems. It provides an estimate of body fat based on height and weight. Your health care provider can find your BMI and can help you achieve or maintain a healthy weight. For males 20 years and older:  A BMI below 18.5 is considered underweight.  A BMI of 18.5 to 24.9 is normal.  A BMI of 25 to 29.9 is considered overweight.  A BMI of 30 and above is considered obese.  Maintain normal blood lipids and cholesterol by exercising and minimizing your intake of saturated fat. Eat a balanced diet with plenty of fruits and vegetables. Blood tests for lipids and cholesterol should begin at age 20 and be repeated every 5 years. If your lipid or cholesterol levels are high, you are over age 50, or you are at high risk for heart disease, you may need your cholesterol levels checked more frequently.Ongoing high lipid and cholesterol levels should be treated with medicines if diet and exercise are not working.  If you smoke, find out from your health care provider how to quit. If you do not use tobacco, do not  start.  Lung cancer screening is recommended for adults aged 55-80 years who are at high risk for developing lung cancer because of a history of smoking. A yearly low-dose CT scan of the lungs is recommended for people who have at least a 30-pack-year history of smoking and are current smokers or have quit within the past 15 years. A pack year of smoking is smoking an average of 1 pack of cigarettes a day for 1 year (for example, a 30-pack-year history of smoking could mean smoking 1 pack a day for 30 years or 2 packs a day for 15 years). Yearly screening should continue until the smoker has stopped smoking for at least 15 years. Yearly screening should be stopped for people who develop a health problem that would prevent them from having lung cancer treatment.  If you choose to drink alcohol, do not have more than 2 drinks per day. One drink is considered to be 12 oz (360 mL) of beer, 5 oz (150 mL) of wine, or 1.5 oz (45 mL) of liquor.  Avoid the use of street drugs. Do not share needles with anyone. Ask for help if you need support or instructions about stopping the use of drugs.  High blood pressure causes heart disease and increases the risk of stroke. High blood pressure is more likely to develop in:  People who have blood pressure in the end of the normal range (100-139/85-89 mm Hg).  People who are overweight or obese.  People who are African American.    If you are 18-39 years of age, have your blood pressure checked every 3-5 years. If you are 40 years of age or older, have your blood pressure checked every year. You should have your blood pressure measured twice--once when you are at a hospital or clinic, and once when you are not at a hospital or clinic. Record the average of the two measurements. To check your blood pressure when you are not at a hospital or clinic, you can use:  An automated blood pressure machine at a pharmacy.  A home blood pressure monitor.  If you are 45-79 years  old, ask your health care provider if you should take aspirin to prevent heart disease.  Diabetes screening involves taking a blood sample to check your fasting blood sugar level. This should be done once every 3 years after age 45 if you are at a normal weight and without risk factors for diabetes. Testing should be considered at a younger age or be carried out more frequently if you are overweight and have at least 1 risk factor for diabetes.  Colorectal cancer can be detected and often prevented. Most routine colorectal cancer screening begins at the age of 50 and continues through age 75. However, your health care provider may recommend screening at an earlier age if you have risk factors for colon cancer. On a yearly basis, your health care provider may provide home test kits to check for hidden blood in the stool. A small camera at the end of a tube may be used to directly examine the colon (sigmoidoscopy or colonoscopy) to detect the earliest forms of colorectal cancer. Talk to your health care provider about this at age 50 when routine screening begins. A direct exam of the colon should be repeated every 5-10 years through age 75, unless early forms of precancerous polyps or small growths are found.  People who are at an increased risk for hepatitis B should be screened for this virus. You are considered at high risk for hepatitis B if:  You were born in a country where hepatitis B occurs often. Talk with your health care provider about which countries are considered high risk.  Your parents were born in a high-risk country and you have not received a shot to protect against hepatitis B (hepatitis B vaccine).  You have HIV or AIDS.  You use needles to inject street drugs.  You live with, or have sex with, someone who has hepatitis B.  You are a man who has sex with other men (MSM).  You get hemodialysis treatment.  You take certain medicines for conditions like cancer, organ  transplantation, and autoimmune conditions.  Hepatitis C blood testing is recommended for all people born from 1945 through 1965 and any individual with known risk factors for hepatitis C.  Healthy men should no longer receive prostate-specific antigen (PSA) blood tests as part of routine cancer screening. Talk to your health care provider about prostate cancer screening.  Testicular cancer screening is not recommended for adolescents or adult males who have no symptoms. Screening includes self-exam, a health care provider exam, and other screening tests. Consult with your health care provider about any symptoms you have or any concerns you have about testicular cancer.  Practice safe sex. Use condoms and avoid high-risk sexual practices to reduce the spread of sexually transmitted infections (STIs).  You should be screened for STIs, including gonorrhea and chlamydia if:  You are sexually active and are younger than 24 years.  You   are older than 24 years, and your health care provider tells you that you are at risk for this type of infection.  Your sexual activity has changed since you were last screened, and you are at an increased risk for chlamydia or gonorrhea. Ask your health care provider if you are at risk.  If you are at risk of being infected with HIV, it is recommended that you take a prescription medicine daily to prevent HIV infection. This is called pre-exposure prophylaxis (PrEP). You are considered at risk if:  You are a man who has sex with other men (MSM).  You are a heterosexual man who is sexually active with multiple partners.  You take drugs by injection.  You are sexually active with a partner who has HIV.  Talk with your health care provider about whether you are at high risk of being infected with HIV. If you choose to begin PrEP, you should first be tested for HIV. You should then be tested every 3 months for as long as you are taking PrEP.  Use sunscreen. Apply  sunscreen liberally and repeatedly throughout the day. You should seek shade when your shadow is shorter than you. Protect yourself by wearing long sleeves, pants, a wide-brimmed hat, and sunglasses year round whenever you are outdoors.  Tell your health care provider of new moles or changes in moles, especially if there is a change in shape or color. Also, tell your health care provider if a mole is larger than the size of a pencil eraser.  A one-time screening for abdominal aortic aneurysm (AAA) and surgical repair of large AAAs by ultrasound is recommended for men aged 65-75 years who are current or former smokers.  Stay current with your vaccines (immunizations).   This information is not intended to replace advice given to you by your health care provider. Make sure you discuss any questions you have with your health care provider.   Document Released: 04/28/2008 Document Revised: 11/21/2014 Document Reviewed: 03/28/2011 Elsevier Interactive Patient Education 2016 Else[employer] Inc[employer]  Actinic Keratosis Actinic keratosis is a precancerous growth on the skin. This means it could develop into skin cancer if it is not treated. About 1% of actinic keratoses turn into skin cancer within a year. It is important to have all such growths removed to prevent them from developing into skin cancer. CAUSES  Actinic keratosis is caused by getting too much ultraviolet (UV) radiation from the sun or other UV light sources. RISK FACTORS Factors that increase your chances of getting actinic keratosis include:  Having light-colored skin and blue eyes.  Having blonde or red hair.  Spending a lot of time in the sun.  Age. The risk of actinic keratosis increases with age. SYMPTOMS  Actinic keratosis growths look like scaly, rough spots of skin. They can be as small as a pinhead or as big as a quarter. They may itch, hurt, or feel sensitive. Sometimes there is a little tag of pink or gray skin growing off them.  In some cases, actinic keratoses are easier felt than seen. They do not go away with the use of moisturizing lotions or creams. Actinic keratoses appear most often on areas of skin that get a lot of sun exposure. These areas include the:  Scalp.  Face.  Ears.  Lips.  Upper back.  Backs of the hands.  Forearms. DIAGNOSIS  Your health care provider can usually tell what is wrong by performing a physical exam. A tissue sample (biopsy) may also  be taken and examined under a microscope. TREATMENT  Actinic keratosis can be treated several ways. Most treatments can be done in your health care provider's office. Treatment options may include:  Curettage. A tool is used to gently scrape off the growth.  Cryosurgery. Liquid nitrogen is applied to the growth to freeze it. The growth eventually falls off the skin.  Medicated creams, such as 5-fluorouracil or imiquimod. The medicine destroys the cells in the growth.  Chemical peels. Chemicals are applied to the growth and the outer layers of skin are peeled off.  Photodynamic therapy. A drug that makes your skin more sensitive to light is applied to the skin. A strong, blue light is aimed at the skin and destroys the growth. PREVENTION  To prevent future sun damage:  Try to avoid the sun between 10:00 a.m. and 4:00 p.m. when it is the strongest.  Use a sunscreen or sunblock with SPF 30 or greater.  Apply sunscreen at least 30 minutes before exposure to the sun.  Always wear protective hats, clothing, and sunglasses with UV protection.  Avoid medicines, herbs, and foods that increase your sensitivity to sunlight.  Avoid tanning beds. HOME CARE INSTRUCTIONS   If your skin was covered with a bandage, change and remove the bandage as directed by your health care provider.  Keep the treated area dry as directed by your health care provider.  Apply any creams as prescribed by your health care provider. Follow the directions  carefully.  Check your skin regularly for any changes.  Visit a skin doctor (dermatologist) every year for a skin exam. SEEK MEDICAL CARE IF:   Your skin does not heal and becomes irritated, red, or bleeds.  You notice any changes or new growths on your skin.   This information is not intended to replace advice given to you by your health care provider. Make sure you discuss any questions you have with your health care provider.   Document Released: 01/27/2009 Document Revised: 11/21/2014 Document Reviewed: 12/12/2011 Elsevier Interactive Patient Education [employer] Elsevier Inc.

## 2016-02-22 ENCOUNTER — Encounter: Payer: Self-pay | Admitting: *Deleted

## 2016-06-02 ENCOUNTER — Telehealth: Payer: Self-pay | Admitting: Family

## 2016-06-02 NOTE — Telephone Encounter (Signed)
Pt notified of Christy's recommendation Verbalizes understanding 

## 2016-06-02 NOTE — Telephone Encounter (Signed)
PT needs to be seen. May be something we can do

## 2016-06-02 NOTE — Telephone Encounter (Signed)
Please address

## 2016-06-03 ENCOUNTER — Encounter: Payer: Self-pay | Admitting: Family

## 2016-06-03 ENCOUNTER — Ambulatory Visit (INDEPENDENT_AMBULATORY_CARE_PROVIDER_SITE_OTHER): Payer: BC Managed Care – PPO | Admitting: Family

## 2016-06-03 VITALS — BP 117/72 | HR 60 | Temp 97.2°F | Ht 71.0 in | Wt 183.6 lb

## 2016-06-03 DIAGNOSIS — E663 Overweight: Secondary | ICD-10-CM

## 2016-06-03 DIAGNOSIS — J309 Allergic rhinitis, unspecified: Secondary | ICD-10-CM | POA: Diagnosis not present

## 2016-06-03 DIAGNOSIS — H938X2 Other specified disorders of left ear: Secondary | ICD-10-CM

## 2016-06-03 DIAGNOSIS — R42 Dizziness and giddiness: Secondary | ICD-10-CM

## 2016-06-03 MED ORDER — FLUTICASONE PROPIONATE 50 MCG/ACT NA SUSP: 2.0000 | Freq: Every day | NASAL | Status: DC

## 2016-06-03 MED ORDER — MECLIZINE HCL 25 MG PO TABS: 25.0000 mg | ORAL_TABLET | Freq: Three times a day (TID) | ORAL | Status: DC | PRN

## 2016-06-03 NOTE — Progress Notes (Signed)
   Subjective:    Patient ID: Brian Briggs, male    DOB: 1961-10-25, 55 y.o.   MRN: [id number]  Ear Fullness  There is pain in the right ear. This is a recurrent problem. The current episode started 1 to 4 weeks ago. The problem occurs every few minutes. The problem has been unchanged. There has been no fever. Associated symptoms include headaches and hearing loss. Pertinent negatives include no coughing, ear discharge or sore throat. Associated symptoms comments: Dizziness . Treatments tried: flonase. The treatment provided no relief.      Review of Systems  HENT: Positive for hearing loss. Negative for ear discharge and sore throat.   Respiratory: Negative.  Negative for cough.   Cardiovascular: Negative.   Neurological: Positive for dizziness and headaches.       Objective:   Physical Exam  Constitutional: He is oriented to person, place, and time. He appears well-developed and well-nourished. No distress.  HENT:  Head: Normocephalic.  Right Ear: External ear normal. Tympanic membrane is bulging.  Left Ear: External ear normal.  Nose: Mucosal edema and rhinorrhea present.  Mouth/Throat: Posterior oropharyngeal erythema present.  Nasal passage erythemas with mild swelling    Eyes: Pupils are equal, round, and reactive to light. Right eye exhibits no discharge. Left eye exhibits no discharge.  Neck: Normal range of motion. Neck supple. No thyromegaly present.  Cardiovascular: Normal rate, regular rhythm, normal heart sounds and intact distal pulses.   No murmur heard. Pulmonary/Chest: Effort normal and breath sounds normal. No respiratory distress. He has no wheezes.  Abdominal: Soft. Bowel sounds are normal. He exhibits no distension. There is no tenderness.  Musculoskeletal: Normal range of motion. He exhibits no edema or tenderness.  Neurological: He is alert and oriented to person, place, and time. He has normal reflexes. No cranial nerve deficit.  Skin: Skin is warm and  dry. No rash noted. No erythema.  Psychiatric: He has a normal mood and affect. His behavior is normal. Judgment and thought content normal.  Vitals reviewed.     BP 117/72 mmHg  Pulse 60  Temp(Src) 97.2 F (36.2 C)  Ht 5\' 11"  (1.803 m)  Wt 183 lb 9.6 oz (83.28 kg)  BMI 25.62 kg/m2     Assessment & Plan:  1. Allergic rhinitis, unspecified allergic rhinitis type - Ambulatory referral to ENT  2. Overweight (BMI 25.0-29.9)  3. Ear fullness, left - Ambulatory referral to ENT - meclizine (ANTIVERT) 25 MG tablet; Take 1 tablet (25 mg total) by mouth 3 (three) times daily as needed for dizziness.  Dispense: 30 tablet; Refill: 0  4. Dizziness - Ambulatory referral to ENT - meclizine (ANTIVERT) 25 MG tablet; Take 1 tablet (25 mg total) by mouth 3 (three) times daily as needed for dizziness.  Dispense: 30 tablet; Refill: 0   Continue flonase and zyrtec  ENT referral pending Antivert as needed for dizziness [place] precaution discussed RTO prn   Jannifer Rodneyhristy Pattiann Solanki, FNP

## 2016-06-03 NOTE — Patient Instructions (Signed)
Allergic Rhinitis Allergic rhinitis is when the mucous membranes in the nose respond to allergens. Allergens are particles in the air that cause your body to have an allergic reaction. This causes you to release allergic antibodies. Through a chain of events, these eventually cause you to release histamine into the blood stream. Although meant to protect the body, it is this release of histamine that causes your discomfort, such as frequent sneezing, congestion, and an itchy, runny nose.  CAUSES Seasonal allergic rhinitis (hay fever) is caused by pollen allergens that may come from grasses, trees, and weeds. Year-round allergic rhinitis (perennial allergic rhinitis) is caused by allergens such as house dust mites, pet dander, and mold spores. SYMPTOMS  Nasal stuffiness (congestion).  Itchy, runny nose with sneezing and tearing of the eyes. DIAGNOSIS Your health care provider can help you determine the allergen or allergens that trigger your symptoms. If you and your health care provider are unable to determine the allergen, skin or blood testing may be used. Your health care provider will diagnose your condition after taking your health history and performing a physical exam. Your health care provider may assess you for other related conditions, such as asthma, pink eye, or an ear infection. TREATMENT Allergic rhinitis does not have a cure, but it can be controlled by:  Medicines that block allergy symptoms. These may include allergy shots, nasal sprays, and oral antihistamines.  Avoiding the allergen. Hay fever may often be treated with antihistamines in pill or nasal spray forms. Antihistamines block the effects of histamine. There are over-the-counter medicines that may help with nasal congestion and swelling around the eyes. Check with your health care provider before taking or giving this medicine. If avoiding the allergen or the medicine prescribed do not work, there are many new medicines  your health care provider can prescribe. Stronger medicine may be used if initial measures are ineffective. Desensitizing injections can be used if medicine and avoidance does not work. Desensitization is when a patient is given ongoing shots until the body becomes less sensitive to the allergen. Make sure you follow up with your health care provider if problems continue. HOME CARE INSTRUCTIONS It is not possible to completely avoid allergens, but you can reduce your symptoms by taking steps to limit your exposure to them. It helps to know exactly what you are allergic to so that you can avoid your specific triggers. SEEK MEDICAL CARE IF:  You have a fever.  You develop a cough that does not stop easily (persistent).  You have shortness of breath.  You start wheezing.  Symptoms interfere with normal daily activities.   This information is not intended to replace advice given to you by your health care provider. Make sure you discuss any questions you have with your health care provider.   Document Released: 07/26/2001 Document Revised: 11/21/2014 Document Reviewed: 07/08/2013 Elsevier Interactive Patient Education 2016 [employer] Inc[employer]  

## 2016-06-10 ENCOUNTER — Telehealth: Payer: Self-pay | Admitting: Family

## 2016-06-21 ENCOUNTER — Other Ambulatory Visit: Payer: Self-pay | Admitting: Family

## 2016-06-21 DIAGNOSIS — E785 Hyperlipidemia, unspecified: Secondary | ICD-10-CM

## 2016-08-18 ENCOUNTER — Encounter: Payer: Self-pay | Admitting: Family

## 2016-08-18 ENCOUNTER — Ambulatory Visit (INDEPENDENT_AMBULATORY_CARE_PROVIDER_SITE_OTHER): Payer: BC Managed Care – PPO | Admitting: Family

## 2016-08-18 VITALS — BP 120/77 | HR 52 | Temp 96.8°F | Ht 71.0 in | Wt 179.8 lb

## 2016-08-18 DIAGNOSIS — Z8679 Personal history of other diseases of the circulatory system: Secondary | ICD-10-CM

## 2016-08-18 DIAGNOSIS — E663 Overweight: Secondary | ICD-10-CM

## 2016-08-18 DIAGNOSIS — K219 Gastro-esophageal reflux disease without esophagitis: Secondary | ICD-10-CM

## 2016-08-18 DIAGNOSIS — Z23 Encounter for immunization: Secondary | ICD-10-CM

## 2016-08-18 DIAGNOSIS — Z8673 Personal history of transient ischemic attack (TIA), and cerebral infarction without residual deficits: Secondary | ICD-10-CM

## 2016-08-18 DIAGNOSIS — J301 Allergic rhinitis due to pollen: Secondary | ICD-10-CM | POA: Diagnosis not present

## 2016-08-18 DIAGNOSIS — Z114 Encounter for screening for human immunodeficiency virus [HIV]: Secondary | ICD-10-CM

## 2016-08-18 DIAGNOSIS — E785 Hyperlipidemia, unspecified: Secondary | ICD-10-CM | POA: Diagnosis not present

## 2016-08-18 NOTE — Patient Instructions (Signed)
Hearing Loss °Hearing loss is a partial or total loss of the ability to hear. This can be temporary or permanent, and it can happen in one or both ears. Hearing loss may be referred to as deafness. °Medical care is necessary to treat hearing loss properly and to prevent the condition from getting worse. Your hearing may partially or completely come back, depending on what caused your hearing loss and how severe it is. In some cases, hearing loss is permanent. °CAUSES °Common causes of hearing loss include:  °· Too much wax in the ear canal.   °· Infection of the ear canal or middle ear.   °· Fluid in the middle ear.   °· Injury to the ear or surrounding area.   °· An object stuck in the ear.   °· Prolonged exposure to loud sounds, such as music.   °Less common causes of hearing loss include:  °· Tumors in the ear.   °· Viral or bacterial infections, such as meningitis.   °· A hole in the eardrum (perforated eardrum). °· Problems with the hearing nerve that sends signals between the brain and the ear. °· Certain medicines.   °SYMPTOMS  °Symptoms of this condition may include: °· Difficulty telling the difference between sounds. °· Difficulty following a conversation when there is background noise. °· Lack of response to sounds in your environment. This may be most noticeable when you do not respond to startling sounds. °· Needing to turn up the volume on the television, radio, etc. °· Ringing in the ears. °· Dizziness. °· Pain in the ears. °DIAGNOSIS °This condition is diagnosed based on a physical exam and a hearing test (audiometry). The audiometry test will be performed by a hearing specialist (audiologist). You may also be referred to an ear, nose, and throat (ENT) specialist (otolaryngologist).  °TREATMENT °Treatment for recent onset of hearing loss may include:  °· Ear wax removal.   °· Being prescribed medicines to prevent infection (antibiotics).   °· Being prescribed medicines to reduce inflammation  (corticosteroids).   °HOME CARE INSTRUCTIONS °· If you were prescribed an antibiotic medicine, take it as told by your health care provider. Do not stop taking the antibiotic even if you start to feel better. °· Take over-the-counter and prescription medicines only as told by your health care provider. °· Avoid loud noises.   °· Return to your normal activities as told by your health care provider. Ask your health care provider what activities are safe for you. °· Keep all follow-up visits as told by your health care provider. This is important. °SEEK MEDICAL CARE IF:  °· You feel dizzy.   °· You develop new symptoms.   °· You vomit or feel nauseous.   °· You have a fever.   °SEEK IMMEDIATE MEDICAL CARE IF: °· You develop sudden changes in your vision.   °· You have severe ear pain.   °· You have new or increased weakness. °· You have a severe headache. °  °This information is not intended to replace advice given to you by your health care provider. Make sure you discuss any questions you have with your health care provider. °  °Document Released: 10/31/2005 Document Revised: 07/22/2015 Document Reviewed: 03/18/2015 °Elsevier Interactive Patient Education ©2016 [employer] Inc[employer] ° °

## 2016-08-18 NOTE — Progress Notes (Addendum)
Subjective:    Patient ID: Brian Briggs, male    DOB: 01/06/1961, 55 y.o.   MRN: [id number]  Pt presents to the office today for chronic follow up.  Hyperlipidemia  This is a chronic problem. The current episode started more than 1 year ago. The problem is controlled. Recent lipid tests were reviewed and are normal. He has no history of diabetes or hypothyroidism. Pertinent negatives include no leg pain or shortness of breath. Current antihyperlipidemic treatment includes statins. The current treatment provides significant improvement of lipids. Risk factors for coronary artery disease include dyslipidemia, family history and male sex.  Gastroesophageal Reflux  He reports no belching or no heartburn. This is a chronic problem. The current episode started more than 1 year ago. The problem occurs rarely. The problem has been waxing and waning. The symptoms are aggravated by certain foods. He has tried a PPI for the symptoms. The treatment provided significant relief.  History of CVA Pt had a CVA in 2009. Pt currently taking aggrenox and Lipitor daily with no complaints.     Review of Systems  Constitutional: Negative.   Respiratory: Negative.  Negative for shortness of breath.   Cardiovascular: Negative.   Gastrointestinal: Negative.  Negative for heartburn.  Endocrine: Negative.   Genitourinary: Negative.   Musculoskeletal: Negative.   Hematological: Negative.   Psychiatric/Behavioral: Negative.   All other systems reviewed and are negative.      Objective:   Physical Exam  Constitutional: He is oriented to person, place, and time. He appears well-developed and well-nourished. No distress.  HENT:  Head: Normocephalic.  Right Ear: External ear normal.  Left Ear: External ear normal.  Nose: Nose normal.  Mouth/Throat: Oropharynx is clear and moist.  Eyes: Pupils are equal, round, and reactive to light. Right eye exhibits no discharge. Left eye exhibits no discharge.  Neck:  Normal range of motion. Neck supple. No thyromegaly present.  Cardiovascular: Normal rate, regular rhythm, normal heart sounds and intact distal pulses.   No murmur heard. Pulmonary/Chest: Effort normal and breath sounds normal. No respiratory distress. He has no wheezes.  Abdominal: Soft. Bowel sounds are normal. He exhibits no distension. There is no tenderness.  Musculoskeletal: Normal range of motion. He exhibits no edema or tenderness.  Neurological: He is alert and oriented to person, place, and time.  Skin: Skin is warm and dry. No rash noted. No erythema.  Psychiatric: He has a normal mood and affect. His behavior is normal. Judgment and thought content normal.  Vitals reviewed.     BP 120/77   Pulse (!) 52   Temp (!) 96.8 F (36 C) (Oral)   Ht 5' 11" (1.803 m)   Wt 179 lb 12.8 oz (81.6 kg)   BMI 25.08 kg/m      Assessment & Plan:  1. Allergic rhinitis due to pollen, unspecified chronicity, unspecified seasonality - CMP14+EGFR  2. Gastroesophageal reflux disease, esophagitis presence not specified - CMP14+EGFR  3. History of cardiovascular disorder - CMP14+EGFR - Lipid panel  4. History of CVA (cerebrovascular accident) - CMP14+EGFR - Lipid panel  5. Hyperlipidemia, unspecified hyperlipidemia type - CMP14+EGFR - Lipid panel  6. Overweight (BMI 25.0-29.9) - CMP14+EGFR  7. Encounter for screening for HIV - CMP14+EGFR - HIV antibody   Continue all meds Labs pending Health Maintenance reviewed Diet and exercise encouraged RTO 6 months   Christy Hawks, FNP   

## 2016-08-19 LAB — CMP14+EGFR
A/G RATIO: 2.3 — AB (ref 1.2–2.2)
ALBUMIN: 4.5 g/dL (ref 3.5–5.5)
ALK PHOS: 68 IU/L (ref 39–117)
ALT: 12 IU/L (ref 0–44)
AST: 28 IU/L (ref 0–40)
BILIRUBIN TOTAL: 0.8 mg/dL (ref 0.0–1.2)
BUN / CREAT RATIO: 19 (ref 9–20)
BUN: 16 mg/dL (ref 6–24)
CO2: 26 mmol/L (ref 18–29)
Calcium: 9.1 mg/dL (ref 8.7–10.2)
Chloride: 101 mmol/L (ref 96–106)
Creatinine, Ser: 0.84 mg/dL (ref 0.76–1.27)
GFR calc non Af Amer: 97 mL/min/{1.73_m2} (ref >59)
GFR, EST AFRICAN AMERICAN: 112 mL/min/{1.73_m2} (ref >59)
GLUCOSE: 102 mg/dL — AB (ref 65–99)
Globulin, Total: 2 g/dL (ref 1.5–4.5)
Potassium: 4.2 mmol/L (ref 3.5–5.2)
Sodium: 141 mmol/L (ref 134–144)
TOTAL PROTEIN: 6.5 g/dL (ref 6.0–8.5)

## 2016-08-19 LAB — LIPID PANEL
CHOL/HDL RATIO: 2 ratio (ref 0.0–5.0)
Cholesterol, Total: 126 mg/dL (ref 100–199)
HDL: 63 mg/dL (ref >39)
LDL CALC: 55 mg/dL (ref 0–99)
TRIGLYCERIDES: 42 mg/dL (ref 0–149)
VLDL CHOLESTEROL CAL: 8 mg/dL (ref 5–40)

## 2016-08-19 LAB — HIV ANTIBODY (ROUTINE TESTING W REFLEX): HIV Screen 4th Generation wRfx: NONREACTIVE

## 2016-08-22 ENCOUNTER — Ambulatory Visit: Payer: BC Managed Care – PPO | Admitting: Family

## 2016-09-09 ENCOUNTER — Other Ambulatory Visit: Payer: Self-pay | Admitting: Family

## 2016-09-09 DIAGNOSIS — Z8679 Personal history of other diseases of the circulatory system: Secondary | ICD-10-CM

## 2016-09-09 DIAGNOSIS — Z8673 Personal history of transient ischemic attack (TIA), and cerebral infarction without residual deficits: Secondary | ICD-10-CM

## 2016-09-21 ENCOUNTER — Other Ambulatory Visit: Payer: Self-pay | Admitting: Family

## 2016-09-21 DIAGNOSIS — E785 Hyperlipidemia, unspecified: Secondary | ICD-10-CM

## 2016-09-21 DIAGNOSIS — K219 Gastro-esophageal reflux disease without esophagitis: Secondary | ICD-10-CM

## 2016-12-16 ENCOUNTER — Other Ambulatory Visit: Payer: Self-pay | Admitting: Family

## 2016-12-16 DIAGNOSIS — Z8679 Personal history of other diseases of the circulatory system: Secondary | ICD-10-CM

## 2016-12-16 DIAGNOSIS — Z8673 Personal history of transient ischemic attack (TIA), and cerebral infarction without residual deficits: Secondary | ICD-10-CM

## 2016-12-26 ENCOUNTER — Other Ambulatory Visit: Payer: Self-pay | Admitting: Family

## 2016-12-26 DIAGNOSIS — E785 Hyperlipidemia, unspecified: Secondary | ICD-10-CM

## 2017-02-06 ENCOUNTER — Ambulatory Visit (INDEPENDENT_AMBULATORY_CARE_PROVIDER_SITE_OTHER): Payer: BC Managed Care – PPO | Admitting: Family

## 2017-02-06 ENCOUNTER — Encounter: Payer: Self-pay | Admitting: Family

## 2017-02-06 VITALS — BP 123/69 | HR 68 | Temp 98.6°F | Ht 71.0 in | Wt 184.0 lb

## 2017-02-06 DIAGNOSIS — E785 Hyperlipidemia, unspecified: Secondary | ICD-10-CM | POA: Diagnosis not present

## 2017-02-06 DIAGNOSIS — J301 Allergic rhinitis due to pollen: Secondary | ICD-10-CM | POA: Diagnosis not present

## 2017-02-06 DIAGNOSIS — Z8679 Personal history of other diseases of the circulatory system: Secondary | ICD-10-CM

## 2017-02-06 DIAGNOSIS — K219 Gastro-esophageal reflux disease without esophagitis: Secondary | ICD-10-CM | POA: Diagnosis not present

## 2017-02-06 DIAGNOSIS — Z8673 Personal history of transient ischemic attack (TIA), and cerebral infarction without residual deficits: Secondary | ICD-10-CM | POA: Diagnosis not present

## 2017-02-06 DIAGNOSIS — E663 Overweight: Secondary | ICD-10-CM | POA: Diagnosis not present

## 2017-02-06 NOTE — Progress Notes (Signed)
   Subjective:    Patient ID: Brian Briggs, male    DOB: [birth year], 56 y.o.   MRN: [id number]  Pt presents to the office today for chronic follow up.  Hyperlipidemia  This is a chronic problem. The current episode started more than 1 year ago. The problem is controlled. Recent lipid tests were reviewed and are normal. He has no history of diabetes or hypothyroidism. Pertinent negatives include no leg pain or shortness of breath. Current antihyperlipidemic treatment includes statins. The current treatment provides significant improvement of lipids. Risk factors for coronary artery disease include dyslipidemia, family history and male sex.  Gastroesophageal Reflux  He reports no belching or no heartburn. This is a chronic problem. The current episode started more than 1 year ago. The problem occurs rarely. The problem has been waxing and waning. The symptoms are aggravated by certain foods. He has tried a PPI for the symptoms. The treatment provided significant relief.  History of CVA Pt had a CVA in 2009. Pt currently taking aggrenox and Lipitor daily with no complaints.  Allergic Rhinitis Taking flonase daily. Stable   Review of Systems  Constitutional: Negative.   Respiratory: Negative.  Negative for shortness of breath.   Cardiovascular: Negative.   Gastrointestinal: Negative.  Negative for heartburn.  Endocrine: Negative.   Genitourinary: Negative.   Musculoskeletal: Negative.   Hematological: Negative.   Psychiatric/Behavioral: Negative.   All other systems reviewed and are negative.      Objective:   Physical Exam  Constitutional: He is oriented to person, place, and time. He appears well-developed and well-nourished. No distress.  HENT:  Head: Normocephalic.  Right Ear: External ear normal.  Left Ear: External ear normal.  Nose: Nose normal.  Mouth/Throat: Oropharynx is clear and moist.  Eyes: Pupils are equal, round, and reactive to light. Right eye exhibits no  discharge. Left eye exhibits no discharge.  Neck: Normal range of motion. Neck supple. No thyromegaly present.  Cardiovascular: Normal rate, regular rhythm, normal heart sounds and intact distal pulses.   No murmur heard. Pulmonary/Chest: Effort normal and breath sounds normal. No respiratory distress. He has no wheezes.  Abdominal: Soft. Bowel sounds are normal. He exhibits no distension. There is no tenderness.  Musculoskeletal: Normal range of motion. He exhibits no edema or tenderness.  Neurological: He is alert and oriented to person, place, and time.  Skin: Skin is warm and dry. No rash noted. No erythema.  Psychiatric: He has a normal mood and affect. His behavior is normal. Judgment and thought content normal.  Vitals reviewed.     BP 123/69   Pulse 68   Temp 98.6 F (37 C) (Oral)   Ht _0  (1.803 m)   Wt 184 lb (83.5 kg)   BMI 25.66 kg/m      Assessment & Plan:  1. Allergic rhinitis due to pollen, unspecified chronicity, unspecified seasonality - CMP14+EGFR  2. History of cardiovascular disorder - CMP14+EGFR - Lipid panel  3. History of CVA (cerebrovascular accident) - CMP14+EGFR - Lipid panel  4. Hyperlipidemia, unspecified hyperlipidemia type - CMP14+EGFR - Lipid panel  5. Overweight (BMI 25.0-29.9) - CMP14+EGFR  6. Gastroesophageal reflux disease, esophagitis presence not specified - CMP14+EGFR   Continue all meds Labs pending Health Maintenance reviewed Diet and exercise encouraged RTO 6 months   Evelina Dun, FNP

## 2017-02-06 NOTE — Patient Instructions (Signed)
 Health Maintenance, Male A healthy lifestyle and preventive care is important for your health and wellness. Ask your health care provider about what schedule of regular examinations is right for you. What should I know about weight and diet?  Eat a Healthy Diet  Eat plenty of vegetables, fruits, whole grains, low-fat dairy products, and lean protein.  Do not eat a lot of foods high in solid fats, added sugars, or salt. Maintain a Healthy Weight  Regular exercise can help you achieve or maintain a healthy weight. You should:  Do at least 150 minutes of exercise each week. The exercise should increase your heart rate and make you sweat (moderate-intensity exercise).  Do strength-training exercises at least twice a week. Watch Your Levels of Cholesterol and Blood Lipids  Have your blood tested for lipids and cholesterol every 5 years starting at 58 years of age. If you are at high risk for heart disease, you should start having your blood tested when you are 58 years old. You may need to have your cholesterol levels checked more often if:  Your lipid or cholesterol levels are high.  You are older than 58 years of age.  You are at high risk for heart disease. What should I know about cancer screening? Many types of cancers can be detected early and may often be prevented. Lung Cancer  You should be screened every year for lung cancer if:  You are a current smoker who has smoked for at least 30 years.  You are a former smoker who has quit within the past 15 years.  Talk to your health care provider about your screening options, when you should start screening, and how often you should be screened. Colorectal Cancer  Routine colorectal cancer screening usually begins at 58 years of age and should be repeated every 5-10 years until you are 58 years old. You may need to be screened more often if early forms of precancerous polyps or small growths are found. Your health care provider  may recommend screening at an earlier age if you have risk factors for colon cancer.  Your health care provider may recommend using home test kits to check for hidden blood in the stool.  A small camera at the end of a tube can be used to examine your colon (sigmoidoscopy or colonoscopy). This checks for the earliest forms of colorectal cancer. Prostate and Testicular Cancer  Depending on your age and overall health, your health care provider may do certain tests to screen for prostate and testicular cancer.  Talk to your health care provider about any symptoms or concerns you have about testicular or prostate cancer. Skin Cancer  Check your skin from head to toe regularly.  Tell your health care provider about any new moles or changes in moles, especially if:  There is a change in a mole's size, shape, or color.  You have a mole that is larger than a pencil eraser.  Always use sunscreen. Apply sunscreen liberally and repeat throughout the day.  Protect yourself by wearing long sleeves, pants, a wide-brimmed hat, and sunglasses when outside. What should I know about heart disease, diabetes, and high blood pressure?  If you are 18-39 years of age, have your blood pressure checked every 3-5 years. If you are 40 years of age or older, have your blood pressure checked every year. You should have your blood pressure measured twice-once when you are at a hospital or clinic, and once when you are not at   a hospital or clinic. Record the average of the two measurements. To check your blood pressure when you are not at a hospital or clinic, you can use:  An automated blood pressure machine at a pharmacy.  A home blood pressure monitor.  Talk to your health care provider about your target blood pressure.  If you are between 45-79 years old, ask your health care provider if you should take aspirin to prevent heart disease.  Have regular diabetes screenings by checking your fasting blood sugar  level.  If you are at a normal weight and have a low risk for diabetes, have this test once every three years after the age of 45.  If you are overweight and have a high risk for diabetes, consider being tested at a younger age or more often.  A one-time screening for abdominal aortic aneurysm (AAA) by ultrasound is recommended for men aged 65-75 years who are current or former smokers. What should I know about preventing infection? Hepatitis B  If you have a higher risk for hepatitis B, you should be screened for this virus. Talk with your health care provider to find out if you are at risk for hepatitis B infection. Hepatitis C  Blood testing is recommended for:  Everyone born from 1945 through 1965.  Anyone with known risk factors for hepatitis C. Sexually Transmitted Diseases (STDs)  You should be screened each year for STDs including gonorrhea and chlamydia if:  You are sexually active and are younger than 58 years of age.  You are older than 58 years of age and your health care provider tells you that you are at risk for this type of infection.  Your sexual activity has changed since you were last screened and you are at an increased risk for chlamydia or gonorrhea. Ask your health care provider if you are at risk.  Talk with your health care provider about whether you are at high risk of being infected with HIV. Your health care provider may recommend a prescription medicine to help prevent HIV infection. What else can I do?  Schedule regular health, dental, and eye exams.  Stay current with your vaccines (immunizations).  Do not use any tobacco products, such as cigarettes, chewing tobacco, and e-cigarettes. If you need help quitting, ask your health care provider.  Limit alcohol intake to no more than 2 drinks per day. One drink equals 12 ounces of beer, 5 ounces of wine, or 1 ounces of hard liquor.  Do not use street drugs.  Do not share needles.  Ask your health  care provider for help if you need support or information about quitting drugs.  Tell your health care provider if you often feel depressed.  Tell your health care provider if you have ever been abused or do not feel safe at home. This information is not intended to replace advice given to you by your health care provider. Make sure you discuss any questions you have with your health care provider. Document Released: 04/28/2008 Document Revised: 06/29/2016 Document Reviewed: 08/04/2015 Elsevier Interactive Patient Education  2017 Else[employer] Inc.  

## 2017-02-07 LAB — CMP14+EGFR
ALT: 15 IU/L (ref 0–44)
AST: 28 IU/L (ref 0–40)
Albumin/Globulin Ratio: 2 (ref 1.2–2.2)
Albumin: 4.3 g/dL (ref 3.5–5.5)
Alkaline Phosphatase: 80 IU/L (ref 39–117)
BUN/Creatinine Ratio: 14 (ref 9–20)
BUN: 13 mg/dL (ref 6–24)
Bilirubin Total: 0.5 mg/dL (ref 0.0–1.2)
CALCIUM: 9.2 mg/dL (ref 8.7–10.2)
CO2: 25 mmol/L (ref 18–29)
Chloride: 102 mmol/L (ref 96–106)
Creatinine, Ser: 0.96 mg/dL (ref 0.76–1.27)
GFR, EST AFRICAN AMERICAN: 100 mL/min/{1.73_m2} (ref >59)
GFR, EST NON AFRICAN AMERICAN: 87 mL/min/{1.73_m2} (ref >59)
GLUCOSE: 97 mg/dL (ref 65–99)
Globulin, Total: 2.2 g/dL (ref 1.5–4.5)
Potassium: 4.3 mmol/L (ref 3.5–5.2)
Sodium: 142 mmol/L (ref 134–144)
TOTAL PROTEIN: 6.5 g/dL (ref 6.0–8.5)

## 2017-02-07 LAB — LIPID PANEL
CHOL/HDL RATIO: 3 ratio (ref 0.0–5.0)
Cholesterol, Total: 148 mg/dL (ref 100–199)
HDL: 49 mg/dL (ref >39)
LDL Calculated: 53 mg/dL (ref 0–99)
TRIGLYCERIDES: 228 mg/dL — AB (ref 0–149)
VLDL CHOLESTEROL CAL: 46 mg/dL — AB (ref 5–40)

## 2017-02-16 ENCOUNTER — Ambulatory Visit: Payer: BC Managed Care – PPO | Admitting: Family

## 2017-03-18 ENCOUNTER — Other Ambulatory Visit: Payer: Self-pay | Admitting: Family

## 2017-03-18 DIAGNOSIS — Z8673 Personal history of transient ischemic attack (TIA), and cerebral infarction without residual deficits: Secondary | ICD-10-CM

## 2017-03-18 DIAGNOSIS — Z8679 Personal history of other diseases of the circulatory system: Secondary | ICD-10-CM

## 2017-03-22 ENCOUNTER — Other Ambulatory Visit: Payer: Self-pay | Admitting: Family

## 2017-03-22 DIAGNOSIS — E785 Hyperlipidemia, unspecified: Secondary | ICD-10-CM

## 2017-03-22 DIAGNOSIS — K219 Gastro-esophageal reflux disease without esophagitis: Secondary | ICD-10-CM

## 2017-06-23 ENCOUNTER — Other Ambulatory Visit: Payer: Self-pay | Admitting: Family

## 2017-06-23 DIAGNOSIS — J309 Allergic rhinitis, unspecified: Secondary | ICD-10-CM

## 2017-07-27 ENCOUNTER — Other Ambulatory Visit: Payer: Self-pay | Admitting: Family

## 2017-07-27 DIAGNOSIS — Z8673 Personal history of transient ischemic attack (TIA), and cerebral infarction without residual deficits: Secondary | ICD-10-CM

## 2017-07-27 DIAGNOSIS — Z8679 Personal history of other diseases of the circulatory system: Secondary | ICD-10-CM

## 2017-08-14 ENCOUNTER — Encounter: Payer: Self-pay | Admitting: Family

## 2017-08-14 ENCOUNTER — Ambulatory Visit (INDEPENDENT_AMBULATORY_CARE_PROVIDER_SITE_OTHER): Payer: BC Managed Care – PPO | Admitting: Family

## 2017-08-14 VITALS — BP 115/78 | HR 58 | Temp 97.8°F | Ht 71.0 in | Wt 187.0 lb

## 2017-08-14 DIAGNOSIS — Z8673 Personal history of transient ischemic attack (TIA), and cerebral infarction without residual deficits: Secondary | ICD-10-CM

## 2017-08-14 DIAGNOSIS — K219 Gastro-esophageal reflux disease without esophagitis: Secondary | ICD-10-CM

## 2017-08-14 DIAGNOSIS — Z0000 Encounter for general adult medical examination without abnormal findings: Secondary | ICD-10-CM | POA: Diagnosis not present

## 2017-08-14 DIAGNOSIS — Z8679 Personal history of other diseases of the circulatory system: Secondary | ICD-10-CM

## 2017-08-14 DIAGNOSIS — E785 Hyperlipidemia, unspecified: Secondary | ICD-10-CM

## 2017-08-14 DIAGNOSIS — J301 Allergic rhinitis due to pollen: Secondary | ICD-10-CM

## 2017-08-14 DIAGNOSIS — E663 Overweight: Secondary | ICD-10-CM

## 2017-08-14 NOTE — Patient Instructions (Signed)

## 2017-08-14 NOTE — Progress Notes (Signed)
   Subjective:    Patient ID: Brian Briggs, male    DOB: 1961-02-06, 56 y.o.   MRN: [id number]  Pt presents to the office today for CPE.  Gastroesophageal Reflux  He reports no belching, no coughing, no hoarse voice or no tooth decay. This is a chronic problem. The current episode started more than 1 year ago. The problem occurs occasionally. The problem has been resolved. The symptoms are aggravated by lying down. He has tried a PPI for the symptoms. The treatment provided moderate relief.  Hyperlipidemia  This is a chronic problem. The current episode started more than 1 year ago. The problem is controlled. Recent lipid tests were reviewed and are normal. Current antihyperlipidemic treatment includes statins. The current treatment provides moderate improvement of lipids. Risk factors for coronary artery disease include a sedentary lifestyle, dyslipidemia, obesity and family history.  Allergic Rhinitis  Pt taking flonase daily. Stable.    Review of Systems  HENT: Negative for hoarse voice.   Respiratory: Negative for cough.   All other systems reviewed and are negative.      Objective:   Physical Exam  Constitutional: He is oriented to person, place, and time. He appears well-developed and well-nourished. No distress.  HENT:  Head: Normocephalic.  Right Ear: External ear normal.  Left Ear: External ear normal.  Nose: Nose normal.  Mouth/Throat: Oropharynx is clear and moist.  Eyes: Pupils are equal, round, and reactive to light. Right eye exhibits no discharge. Left eye exhibits no discharge.  Neck: Normal range of motion. Neck supple. No thyromegaly present.  Cardiovascular: Normal rate, regular rhythm, normal heart sounds and intact distal pulses.   No murmur heard. Pulmonary/Chest: Effort normal and breath sounds normal. No respiratory distress. He has no wheezes.  Abdominal: Soft. Bowel sounds are normal. He exhibits no distension. There is no tenderness.  Musculoskeletal:  Normal range of motion. He exhibits no edema or tenderness.  Neurological: He is alert and oriented to person, place, and time.  Skin: Skin is warm and dry. No rash noted. No erythema.  Psychiatric: He has a normal mood and affect. His behavior is normal. Judgment and thought content normal.  Vitals reviewed.     BP 115/78   Pulse (!) 58   Temp 97.8 F (36.6 C) (Oral)   Ht '5\' 11"'$  (1.803 m)   Wt 187 lb (84.8 kg)   BMI 26.08 kg/m      Assessment & Plan:  1. Annual physical exam - CBC with Differential/Platelet - CMP14+EGFR - Lipid panel - TSH - VITAMIN D 25 Hydroxy (Vit-D Deficiency, Fractures) - PSA, total and free  2. Gastroesophageal reflux disease, esophagitis presence not specified - CMP14+EGFR  3. Hyperlipidemia, unspecified hyperlipidemia type - CMP14+EGFR - Lipid panel  4. Overweight (BMI 25.0-29.9) - CMP14+EGFR  5. History of CVA (cerebrovascular accident) - CMP14+EGFR  6. History of cardiovascular disorder - CMP14+EGFR  7. Allergic rhinitis due to pollen, unspecified seasonality - CMP14+EGFR   Continue all meds Labs pending Health Maintenance reviewed Diet and exercise encouraged RTO 6 months   Evelina Dun, FNP

## 2017-08-15 LAB — CMP14+EGFR
A/G RATIO: 2 (ref 1.2–2.2)
ALT: 13 IU/L (ref 0–44)
AST: 32 IU/L (ref 0–40)
Albumin: 4.5 g/dL (ref 3.5–5.5)
Alkaline Phosphatase: 75 IU/L (ref 39–117)
BUN / CREAT RATIO: 12 (ref 9–20)
BUN: 11 mg/dL (ref 6–24)
Bilirubin Total: 0.6 mg/dL (ref 0.0–1.2)
CO2: 28 mmol/L (ref 20–29)
CREATININE: 0.95 mg/dL (ref 0.76–1.27)
Calcium: 9.9 mg/dL (ref 8.7–10.2)
Chloride: 103 mmol/L (ref 96–106)
GFR calc Af Amer: 102 mL/min/{1.73_m2} (ref >59)
GFR, EST NON AFRICAN AMERICAN: 88 mL/min/{1.73_m2} (ref >59)
Globulin, Total: 2.2 g/dL (ref 1.5–4.5)
Glucose: 95 mg/dL (ref 65–99)
Potassium: 5.1 mmol/L (ref 3.5–5.2)
SODIUM: 143 mmol/L (ref 134–144)
TOTAL PROTEIN: 6.7 g/dL (ref 6.0–8.5)

## 2017-08-15 LAB — CBC WITH DIFFERENTIAL/PLATELET
BASOS ABS: 0 10*3/uL (ref 0.0–0.2)
Basos: 1 %
EOS (ABSOLUTE): 0.1 10*3/uL (ref 0.0–0.4)
Eos: 1 %
Hematocrit: 39.8 % (ref 37.5–51.0)
Hemoglobin: 14.8 g/dL (ref 13.0–17.7)
Immature Grans (Abs): 0 10*3/uL (ref 0.0–0.1)
Immature Granulocytes: 0 %
Lymphocytes Absolute: 1.9 10*3/uL (ref 0.7–3.1)
Lymphs: 45 %
MCH: 32.5 pg (ref 26.6–33.0)
MCHC: 37.2 g/dL — ABNORMAL HIGH (ref 31.5–35.7)
MCV: 87 fL (ref 79–97)
MONOS ABS: 0.3 10*3/uL (ref 0.1–0.9)
Monocytes: 7 %
Neutrophils Absolute: 2 10*3/uL (ref 1.4–7.0)
Neutrophils: 46 %
PLATELETS: 187 10*3/uL (ref 150–379)
RBC: 4.56 x10E6/uL (ref 4.14–5.80)
RDW: 12.6 % (ref 12.3–15.4)
WBC: 4.2 10*3/uL (ref 3.4–10.8)

## 2017-08-15 LAB — LIPID PANEL
CHOL/HDL RATIO: 2.7 ratio (ref 0.0–5.0)
Cholesterol, Total: 131 mg/dL (ref 100–199)
HDL: 48 mg/dL (ref >39)
LDL CALC: 57 mg/dL (ref 0–99)
TRIGLYCERIDES: 128 mg/dL (ref 0–149)
VLDL Cholesterol Cal: 26 mg/dL (ref 5–40)

## 2017-08-15 LAB — PSA, TOTAL AND FREE
PROSTATE SPECIFIC AG, SERUM: 0.5 ng/mL (ref 0.0–4.0)
PSA FREE: 0.31 ng/mL
PSA, Free Pct: 62 %

## 2017-08-15 LAB — VITAMIN D 25 HYDROXY (VIT D DEFICIENCY, FRACTURES): Vit D, 25-Hydroxy: 49.7 ng/mL (ref 30.0–100.0)

## 2017-08-15 LAB — TSH: TSH: 4.39 u[IU]/mL (ref 0.450–4.500)

## 2017-08-21 ENCOUNTER — Other Ambulatory Visit: Payer: Self-pay | Admitting: Family

## 2017-08-21 DIAGNOSIS — J309 Allergic rhinitis, unspecified: Secondary | ICD-10-CM

## 2017-08-29 ENCOUNTER — Other Ambulatory Visit: Payer: Self-pay | Admitting: Family

## 2017-08-29 DIAGNOSIS — Z8679 Personal history of other diseases of the circulatory system: Secondary | ICD-10-CM

## 2017-08-29 DIAGNOSIS — Z8673 Personal history of transient ischemic attack (TIA), and cerebral infarction without residual deficits: Secondary | ICD-10-CM

## 2017-09-25 ENCOUNTER — Other Ambulatory Visit: Payer: Self-pay | Admitting: Family

## 2017-09-25 DIAGNOSIS — K219 Gastro-esophageal reflux disease without esophagitis: Secondary | ICD-10-CM

## 2017-09-25 DIAGNOSIS — E785 Hyperlipidemia, unspecified: Secondary | ICD-10-CM

## 2017-12-04 ENCOUNTER — Other Ambulatory Visit: Payer: Self-pay | Admitting: Family

## 2017-12-04 DIAGNOSIS — Z8673 Personal history of transient ischemic attack (TIA), and cerebral infarction without residual deficits: Secondary | ICD-10-CM

## 2017-12-04 DIAGNOSIS — Z8679 Personal history of other diseases of the circulatory system: Secondary | ICD-10-CM

## 2018-01-02 ENCOUNTER — Other Ambulatory Visit: Payer: Self-pay | Admitting: Family

## 2018-01-02 DIAGNOSIS — E785 Hyperlipidemia, unspecified: Secondary | ICD-10-CM

## 2018-01-02 DIAGNOSIS — Z8673 Personal history of transient ischemic attack (TIA), and cerebral infarction without residual deficits: Secondary | ICD-10-CM

## 2018-01-02 DIAGNOSIS — Z8679 Personal history of other diseases of the circulatory system: Secondary | ICD-10-CM

## 2018-02-06 ENCOUNTER — Other Ambulatory Visit: Payer: Self-pay | Admitting: Family

## 2018-02-06 DIAGNOSIS — Z8679 Personal history of other diseases of the circulatory system: Secondary | ICD-10-CM

## 2018-02-06 DIAGNOSIS — Z8673 Personal history of transient ischemic attack (TIA), and cerebral infarction without residual deficits: Secondary | ICD-10-CM

## 2018-02-12 ENCOUNTER — Encounter: Payer: Self-pay | Admitting: Family

## 2018-02-12 ENCOUNTER — Ambulatory Visit: Payer: BC Managed Care – PPO | Admitting: Family

## 2018-02-12 VITALS — BP 110/69 | HR 58 | Temp 97.0°F | Ht 71.0 in | Wt 191.4 lb

## 2018-02-12 DIAGNOSIS — Z23 Encounter for immunization: Secondary | ICD-10-CM | POA: Diagnosis not present

## 2018-02-12 DIAGNOSIS — J301 Allergic rhinitis due to pollen: Secondary | ICD-10-CM

## 2018-02-12 DIAGNOSIS — Z8679 Personal history of other diseases of the circulatory system: Secondary | ICD-10-CM | POA: Diagnosis not present

## 2018-02-12 DIAGNOSIS — K219 Gastro-esophageal reflux disease without esophagitis: Secondary | ICD-10-CM

## 2018-02-12 DIAGNOSIS — E785 Hyperlipidemia, unspecified: Secondary | ICD-10-CM | POA: Diagnosis not present

## 2018-02-12 DIAGNOSIS — Z8673 Personal history of transient ischemic attack (TIA), and cerebral infarction without residual deficits: Secondary | ICD-10-CM | POA: Diagnosis not present

## 2018-02-12 DIAGNOSIS — E663 Overweight: Secondary | ICD-10-CM | POA: Diagnosis not present

## 2018-02-12 DIAGNOSIS — Z1159 Encounter for screening for other viral diseases: Secondary | ICD-10-CM | POA: Diagnosis not present

## 2018-02-12 NOTE — Progress Notes (Signed)
   Subjective:    Patient ID: Brian Briggs, male    DOB: [birth year], 59 y.o.   MRN: [id number]  PT presents to the office today for chronic follow up. Pt had a CVA when he was 29. Pt reports he has had a thyroid nodule, but had part of this thyroid removed. Reports doing since then.  Gastroesophageal Reflux  He reports no belching, no coughing or no heartburn. This is a chronic problem. The current episode started more than 1 year ago. The problem occurs occasionally. The symptoms are aggravated by certain foods. He has tried a PPI for the symptoms. The treatment provided moderate relief.  Hyperlipidemia  This is a chronic problem. The current episode started more than 1 year ago. The problem is controlled. Recent lipid tests were reviewed and are normal. Current antihyperlipidemic treatment includes statins. The current treatment provides moderate improvement of lipids. Risk factors for coronary artery disease include dyslipidemia, hypertension, male sex and a sedentary lifestyle.      Review of Systems  Respiratory: Negative for cough.   Gastrointestinal: Negative for heartburn.  All other systems reviewed and are negative.      Objective:   Physical Exam  Constitutional: He is oriented to person, place, and time. He appears well-developed and well-nourished. No distress.  HENT:  Head: Normocephalic.  Right Ear: External ear normal.  Left Ear: External ear normal.  Nose: Nose normal.  Mouth/Throat: Oropharynx is clear and moist.  Eyes: Pupils are equal, round, and reactive to light. Right eye exhibits no discharge. Left eye exhibits no discharge.  Neck: Normal range of motion. Neck supple. No thyromegaly present.  Cardiovascular: Normal rate, regular rhythm, normal heart sounds and intact distal pulses.  No murmur heard. Pulmonary/Chest: Effort normal and breath sounds normal. No respiratory distress. He has no wheezes.  Abdominal: Soft. Bowel sounds are normal. He exhibits no  distension. There is no tenderness.  Musculoskeletal: Normal range of motion. He exhibits no edema or tenderness.  Neurological: He is alert and oriented to person, place, and time.  Skin: Skin is warm and dry. No rash noted. No erythema.  Psychiatric: He has a normal mood and affect. His behavior is normal. Judgment and thought content normal.  Vitals reviewed.     BP 110/69   Pulse (!) 58   Temp (!) 97 F (36.1 C) (Oral)   Ht _0  (1.803 m)   Wt 191 lb 6.4 oz (86.8 kg)   BMI 26.69 kg/m      Assessment & Plan:  1. Gastroesophageal reflux disease, esophagitis presence not specified - CMP14+EGFR  2. Allergic rhinitis due to pollen, unspecified seasonality - CMP14+EGFR  3. Overweight (BMI 25.0-29.9) - CMP14+EGFR  4. Hyperlipidemia, unspecified hyperlipidemia type - CMP14+EGFR - Lipid panel - TSH  5. History of CVA (cerebrovascular accident) - CMP14+EGFR - Lipid panel - TSH  6. History of cardiovascular disorder - CMP14+EGFR  7. Need for hepatitis C screening test - CMP14+EGFR - Hepatitis C antibody   Continue all meds Labs pending Health Maintenance reviewed- TDAP given  Diet and exercise encouraged RTO 6 months   Evelina Dun, FNP

## 2018-02-12 NOTE — Patient Instructions (Signed)

## 2018-02-12 NOTE — Addendum Note (Signed)
Addended by: Almeta MonasSTONE, Diany Formosa M on: 02/12/2018 10:02 AM   Modules accepted: Orders

## 2018-02-13 ENCOUNTER — Other Ambulatory Visit: Payer: Self-pay | Admitting: Family

## 2018-02-13 DIAGNOSIS — E039 Hypothyroidism, unspecified: Secondary | ICD-10-CM | POA: Insufficient documentation

## 2018-02-13 LAB — CMP14+EGFR
ALT: 13 IU/L (ref 0–44)
AST: 30 IU/L (ref 0–40)
Albumin/Globulin Ratio: 2 (ref 1.2–2.2)
Albumin: 4.3 g/dL (ref 3.5–5.5)
Alkaline Phosphatase: 87 IU/L (ref 39–117)
BILIRUBIN TOTAL: 0.6 mg/dL (ref 0.0–1.2)
BUN/Creatinine Ratio: 14 (ref 9–20)
BUN: 14 mg/dL (ref 6–24)
CHLORIDE: 105 mmol/L (ref 96–106)
CO2: 22 mmol/L (ref 20–29)
Calcium: 9.3 mg/dL (ref 8.7–10.2)
Creatinine, Ser: 0.98 mg/dL (ref 0.76–1.27)
GFR calc Af Amer: 97 mL/min/{1.73_m2} (ref >59)
GFR calc non Af Amer: 84 mL/min/{1.73_m2} (ref >59)
GLUCOSE: 93 mg/dL (ref 65–99)
Globulin, Total: 2.1 g/dL (ref 1.5–4.5)
Potassium: 4.3 mmol/L (ref 3.5–5.2)
Sodium: 145 mmol/L — ABNORMAL HIGH (ref 134–144)
TOTAL PROTEIN: 6.4 g/dL (ref 6.0–8.5)

## 2018-02-13 LAB — LIPID PANEL
CHOLESTEROL TOTAL: 125 mg/dL (ref 100–199)
Chol/HDL Ratio: 2.7 ratio (ref 0.0–5.0)
HDL: 46 mg/dL (ref >39)
LDL CALC: 64 mg/dL (ref 0–99)
TRIGLYCERIDES: 75 mg/dL (ref 0–149)
VLDL Cholesterol Cal: 15 mg/dL (ref 5–40)

## 2018-02-13 LAB — TSH: TSH: 4.93 u[IU]/mL — AB (ref 0.450–4.500)

## 2018-02-13 LAB — HEPATITIS C ANTIBODY: Hep C Virus Ab: <0.1 s/co ratio (ref 0.0–0.9)

## 2018-02-13 MED ORDER — LEVOTHYROXINE SODIUM 50 MCG PO TABS: 50.0000 ug | ORAL_TABLET | Freq: Every day | ORAL | 1 refills | Status: DC

## 2018-03-07 ENCOUNTER — Other Ambulatory Visit: Payer: Self-pay | Admitting: Family

## 2018-03-07 DIAGNOSIS — Z8679 Personal history of other diseases of the circulatory system: Secondary | ICD-10-CM

## 2018-03-07 DIAGNOSIS — Z8673 Personal history of transient ischemic attack (TIA), and cerebral infarction without residual deficits: Secondary | ICD-10-CM

## 2018-03-28 ENCOUNTER — Other Ambulatory Visit: Payer: Self-pay | Admitting: Family

## 2018-03-28 DIAGNOSIS — K219 Gastro-esophageal reflux disease without esophagitis: Secondary | ICD-10-CM

## 2018-03-28 DIAGNOSIS — E785 Hyperlipidemia, unspecified: Secondary | ICD-10-CM

## 2018-06-11 ENCOUNTER — Other Ambulatory Visit: Payer: Self-pay | Admitting: Family

## 2018-06-11 DIAGNOSIS — Z8679 Personal history of other diseases of the circulatory system: Secondary | ICD-10-CM

## 2018-06-11 DIAGNOSIS — Z8673 Personal history of transient ischemic attack (TIA), and cerebral infarction without residual deficits: Secondary | ICD-10-CM

## 2018-07-09 ENCOUNTER — Other Ambulatory Visit: Payer: Self-pay | Admitting: Family

## 2018-07-09 DIAGNOSIS — Z8673 Personal history of transient ischemic attack (TIA), and cerebral infarction without residual deficits: Secondary | ICD-10-CM

## 2018-07-09 DIAGNOSIS — Z8679 Personal history of other diseases of the circulatory system: Secondary | ICD-10-CM

## 2018-07-30 ENCOUNTER — Other Ambulatory Visit: Payer: Self-pay | Admitting: Family

## 2018-08-03 ENCOUNTER — Encounter: Payer: Self-pay | Admitting: Family

## 2018-08-03 ENCOUNTER — Ambulatory Visit: Payer: BC Managed Care – PPO | Admitting: Family

## 2018-08-03 VITALS — BP 121/68 | HR 55 | Temp 97.3°F | Ht 71.0 in | Wt 180.8 lb

## 2018-08-03 DIAGNOSIS — Z8673 Personal history of transient ischemic attack (TIA), and cerebral infarction without residual deficits: Secondary | ICD-10-CM

## 2018-08-03 DIAGNOSIS — E663 Overweight: Secondary | ICD-10-CM

## 2018-08-03 DIAGNOSIS — E039 Hypothyroidism, unspecified: Secondary | ICD-10-CM

## 2018-08-03 DIAGNOSIS — K219 Gastro-esophageal reflux disease without esophagitis: Secondary | ICD-10-CM

## 2018-08-03 DIAGNOSIS — E785 Hyperlipidemia, unspecified: Secondary | ICD-10-CM

## 2018-08-03 NOTE — Patient Instructions (Signed)

## 2018-08-03 NOTE — Progress Notes (Signed)
re

## 2018-08-03 NOTE — Progress Notes (Signed)
Subjective:    Patient ID: Brian Briggs, male    DOB: 1961/06/03, 57 y.o.   MRN: [id number]  Chief Complaint  Patient presents with  . 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 Willaim Rayas. 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   I asked patient if I could write this note, but states he was told it had be from his neurologists. He has not deficits from the CVA.   Gastroesophageal Reflux  He reports no belching, no coughing or no heartburn. This is a chronic problem. The current episode started more than 1 year ago. The problem occurs occasionally. The problem has been waxing and waning. The symptoms are aggravated by certain foods. Pertinent negatives include no fatigue. He has tried a PPI for the symptoms. The treatment provided moderate relief.  Hyperlipidemia  This is a chronic problem. The current episode started more than 1 year ago. The problem is controlled. Recent lipid tests were reviewed and are normal. Current antihyperlipidemic treatment includes statins. The current treatment provides moderate improvement of lipids. Risk factors for coronary artery disease include dyslipidemia, male sex and a sedentary lifestyle.  Thyroid Problem  Presents for follow-up visit. Patient reports no constipation, depressed mood, diarrhea or fatigue. The symptoms have been stable. His past medical history is significant for hyperlipidemia.     Review of Systems  Constitutional: Negative for fatigue.  Respiratory: Negative for cough.   Gastrointestinal: Negative for constipation, diarrhea and heartburn.  All other systems reviewed and are negative.      Objective:   Physical Exam  Constitutional: He is  oriented to person, place, and time. He appears well-developed and well-nourished. No distress.  HENT:  Head: Normocephalic.  Right Ear: External ear normal.  Left Ear: External ear normal.  Mouth/Throat: Oropharynx is clear and moist.  Eyes: Pupils are equal, round, and reactive to light. Right eye exhibits no discharge. Left eye exhibits no discharge.  Neck: Normal range of motion. Neck supple. No thyromegaly present.  Cardiovascular: Normal rate, regular rhythm, normal heart sounds and intact distal pulses.  No murmur heard. Pulmonary/Chest: Effort normal and breath sounds normal. No respiratory distress. He has no wheezes.  Abdominal: Soft. Bowel sounds are normal. He exhibits no distension. There is no tenderness.  Musculoskeletal: Normal range of motion. He exhibits no edema or tenderness.  Neurological: He is alert and oriented to person, place, and time. He has normal reflexes. No cranial nerve deficit.  Skin: Skin is warm and dry. No rash noted. No erythema.  Psychiatric: He has a normal mood and affect. His behavior is normal. Judgment and thought content normal.  Vitals reviewed.     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 Willaim Rayas. Patient  just found this out when he had his dot physical)   Diagnosis and orders addressed:  1. History of CVA (cerebrovascular accident) - CMP14+EGFR - CBC with Differential/Platelet - Ambulatory referral to Neurology  2. Gastroesophageal reflux disease, esophagitis presence not specified - CMP14+EGFR - CBC with Differential/Platelet  3. Hyperlipidemia, unspecified hyperlipidemia type - CMP14+EGFR - CBC with Differential/Platelet - Lipid panel  4.  Overweight (BMI 25.0-29.9) - CMP14+EGFR - CBC with Differential/Platelet  5. Hypothyroidism, unspecified type - CMP14+EGFR - CBC with Differential/Platelet - TSH   Labs pending Health Maintenance reviewed Diet and exercise encouraged  Follow up plan: 6 months    Evelina Dun, FNP

## 2018-08-04 LAB — CBC WITH DIFFERENTIAL/PLATELET
BASOS ABS: 0 10*3/uL (ref 0.0–0.2)
Basos: 1 %
EOS (ABSOLUTE): 0.1 10*3/uL (ref 0.0–0.4)
EOS: 2 %
HEMOGLOBIN: 13.6 g/dL (ref 13.0–17.7)
Hematocrit: 38.1 % (ref 37.5–51.0)
Immature Grans (Abs): 0 10*3/uL (ref 0.0–0.1)
Immature Granulocytes: 0 %
LYMPHS ABS: 2.1 10*3/uL (ref 0.7–3.1)
Lymphs: 45 %
MCH: 30.2 pg (ref 26.6–33.0)
MCHC: 35.7 g/dL (ref 31.5–35.7)
MCV: 85 fL (ref 79–97)
MONOCYTES: 6 %
Monocytes Absolute: 0.3 10*3/uL (ref 0.1–0.9)
NEUTROS ABS: 2.1 10*3/uL (ref 1.4–7.0)
Neutrophils: 46 %
Platelets: 159 10*3/uL (ref 150–450)
RBC: 4.5 x10E6/uL (ref 4.14–5.80)
RDW: 12.3 % (ref 12.3–15.4)
WBC: 4.6 10*3/uL (ref 3.4–10.8)

## 2018-08-04 LAB — CMP14+EGFR
ALBUMIN: 4 g/dL (ref 3.5–5.5)
ALT: 12 IU/L (ref 0–44)
AST: 26 IU/L (ref 0–40)
Albumin/Globulin Ratio: 1.7 (ref 1.2–2.2)
Alkaline Phosphatase: 78 IU/L (ref 39–117)
BILIRUBIN TOTAL: 0.6 mg/dL (ref 0.0–1.2)
BUN / CREAT RATIO: 13 (ref 9–20)
BUN: 12 mg/dL (ref 6–24)
CHLORIDE: 102 mmol/L (ref 96–106)
CO2: 25 mmol/L (ref 20–29)
Calcium: 9.2 mg/dL (ref 8.7–10.2)
Creatinine, Ser: 0.96 mg/dL (ref 0.76–1.27)
GFR calc Af Amer: 100 mL/min/{1.73_m2} (ref >59)
GFR calc non Af Amer: 86 mL/min/{1.73_m2} (ref >59)
GLUCOSE: 90 mg/dL (ref 65–99)
Globulin, Total: 2.3 g/dL (ref 1.5–4.5)
Potassium: 4.5 mmol/L (ref 3.5–5.2)
SODIUM: 141 mmol/L (ref 134–144)
TOTAL PROTEIN: 6.3 g/dL (ref 6.0–8.5)

## 2018-08-04 LAB — LIPID PANEL
Chol/HDL Ratio: 2.9 ratio (ref 0.0–5.0)
Cholesterol, Total: 129 mg/dL (ref 100–199)
HDL: 44 mg/dL (ref >39)
LDL CALC: 65 mg/dL (ref 0–99)
Triglycerides: 100 mg/dL (ref 0–149)
VLDL Cholesterol Cal: 20 mg/dL (ref 5–40)

## 2018-08-04 LAB — TSH: TSH: 0.176 u[IU]/mL — ABNORMAL LOW (ref 0.450–4.500)

## 2018-08-07 ENCOUNTER — Other Ambulatory Visit: Payer: Self-pay | Admitting: Family

## 2018-08-07 MED ORDER — LEVOTHYROXINE SODIUM 25 MCG PO TABS: 25.0000 ug | ORAL_TABLET | Freq: Every day | ORAL | 1 refills | Status: DC

## 2018-08-14 ENCOUNTER — Encounter: Payer: Self-pay | Admitting: Neurology

## 2018-08-14 ENCOUNTER — Ambulatory Visit: Payer: BC Managed Care – PPO | Admitting: Family

## 2018-08-14 ENCOUNTER — Ambulatory Visit: Payer: BC Managed Care – PPO | Admitting: Neurology

## 2018-08-14 ENCOUNTER — Other Ambulatory Visit: Payer: Self-pay

## 2018-08-14 ENCOUNTER — Other Ambulatory Visit: Payer: Self-pay | Admitting: Family

## 2018-08-14 VITALS — BP 105/68 | HR 53 | Ht 71.0 in | Wt 181.5 lb

## 2018-08-14 DIAGNOSIS — Z8673 Personal history of transient ischemic attack (TIA), and cerebral infarction without residual deficits: Secondary | ICD-10-CM

## 2018-08-14 DIAGNOSIS — Z8679 Personal history of other diseases of the circulatory system: Secondary | ICD-10-CM

## 2018-08-14 NOTE — Progress Notes (Signed)
Reason for visit: History of stroke  Referring physician: Dr. Carmelina Peal is a 57 y.o. 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  The patient went on Coumadin therapy about 3 or 4 months, cerebral angiogram done at that time showed good healing of the vessel.  The patient has been on antiplatelet agents since that time and he has done quite well.  The patient had left-sided weakness, some changes with vision off to the left following the stroke initially.  Over time, he has not had any significant residual with strength, sensation, or balance.  He reports some mild peripheral vision changes in the left lower quadrant.  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  He is sent to this office for an evaluation.  He reports no headaches, dizziness, speech or swallowing problems.  He denies issues controlling the bowels or the bladder.  Past Medical History:  Diagnosis Date  . CVA (cerebral vascular accident) (HCC)   . GERD (gastroesophageal reflux disease)   . Hyperlipidemia   . Thyroid cyst   . Thyroid nodule     Past Surgical History:  Procedure Laterality Date  . THYROIDECTOMY, PARTIAL      Family History  Problem Relation Age of Onset  . Hyperlipidemia Mother   . Cancer Father        prostate  . Hyperlipidemia Brother     Social history:  reports that he has never smoked. He has never used smokeless tobacco. He reports that he drinks alcohol. He reports that he does not use drugs.  Medications:  Prior to Admission medications   Medication Sig Start Date End Date Taking? Authorizing Provider  aspirin (SB LOW DOSE ASA EC) 81 MG EC tablet Take 81 mg by mouth daily.     Yes [provider]  atorvastatin (LIPITOR) 40 MG tablet TAKE 1 TABLET ONCE DAILY AT 6PM 03/28/18  Yes Hawks, Christy A, FNP  dipyridamole-aspirin (AGGRENOX) 200-25 MG 12hr capsule TAKE (1) CAPSULE TWICE DAILY. 07/10/18  Yes Hawks, Christy A, FNP  fluticasone (FLONASE) 50 MCG/ACT nasal spray USE 2 SPRAYS IN EACH NOSTRIL ONCE DAILY. Patient taking differently: Place 2 sprays into both nostrils as needed.  08/22/17  Yes Hawks, Christy A, FNP  levothyroxine (SYNTHROID, LEVOTHROID) 25 MCG tablet Take 1 tablet (25 mcg total) by mouth daily before breakfast. 08/07/18  Yes Hawks, Christy A, FNP  omeprazole (PRILOSEC) 40 MG capsule TAKE (1) CAPSULE DAILY 03/28/18  Yes Jannifer Rodney A, FNP      Allergies  Allergen Reactions  . Amoxicillin   . Penicillin G Rash    ROS:  Out of a complete 14 system review of symptoms, the patient complains only of the following symptoms, and all other reviewed systems are negative.  History of stroke  Blood pressure 105/68, pulse (!) 53, height 5\' 11"  (1.803 m), weight 181 lb 8 oz (82.3 kg).  Physical Exam  General: The patient is alert and cooperative at the time of the examination.  Eyes: Pupils are equal, round, and reactive to light. Discs are flat bilaterally.  Neck: The neck is supple, no carotid bruits are noted.  Respiratory: The respiratory examination is  clear.  Cardiovascular: The cardiovascular examination reveals a regular rate and rhythm, no obvious murmurs or rubs are noted.  Skin: Extremities are without significant edema.  Neurologic Exam  Mental status: The patient is alert and oriented x 3 at the time of the examination. The patient has apparent normal recent and remote memory, with an apparently normal attention span and concentration ability.  Cranial nerves: Facial symmetry is present. There is good sensation of the face to pinprick and soft touch bilaterally. The strength of the facial muscles and the muscles to head turning and shoulder shrug are  normal bilaterally. Speech is well enunciated, no aphasia or dysarthria is noted. Extraocular movements are full. Visual fields are full. The tongue is midline, and the patient has symmetric elevation of the soft palate. No obvious hearing deficits are noted.  Motor: The motor testing reveals 5 over 5 strength of all 4 extremities. Good symmetric motor tone is noted throughout.  Sensory: Sensory testing is intact to pinprick, soft touch, vibration sensation, and position sense on all 4 extremities. No evidence of extinction is noted.  Coordination: Cerebellar testing reveals good finger-nose-finger and heel-to-shin bilaterally.  Gait and station: Gait is normal. Tandem gait is normal. Romberg is negative. No drift is seen.  Reflexes: Deep tendon reflexes are symmetric and normal bilaterally. Toes are downgoing bilaterally.   MRI brain 03/23/06:  IMPRESSION: 1.  Subacute infarct posterior right MCA distribution.   2.  Absent flow void in the distal right internal carotid artery to the level of the ophthalmic compatible with occluded or extremely slow flow.  * MRI scan images were reviewed online. I agree with the written report.   MRA head 03/23/06:  IMPRESSION:  1.  Occluded or extremely slow flow in the distal right internal carotid artery below level of cavernous and more fully reconstituted at the level of the ophthalmic artery. 2.  No signal within posterior branch of the right MCA may represent occlusion or extremely slow flow.     Assessment/Plan:  1.  History of right parietal stroke  2.  Right carotid artery dissection, healed  The patient appears to have no significant residual deficits from the stroke that occurred in 2007.  The patient is followed annually through an optometrist, they check his visual fields regularly.  The patient reports some alteration in the right inferior quadrant but on clinical examination today he has no significant peripheral vision deficits to  confrontation.  At this point, the patient is cleared to return to work in full capacity, he is an adequate candidate to hold a CDL.  A letter was written in this regard.  The patient will follow-up through this office if needed.  Brian Palau MD 08/14/2018 9:17 AM  [facility] [address] [place], [place] [address]  Phone [phone] Fax [phone]

## 2018-09-24 ENCOUNTER — Other Ambulatory Visit: Payer: Self-pay | Admitting: Family

## 2018-09-24 DIAGNOSIS — K219 Gastro-esophageal reflux disease without esophagitis: Secondary | ICD-10-CM

## 2018-09-24 DIAGNOSIS — E785 Hyperlipidemia, unspecified: Secondary | ICD-10-CM

## 2018-11-03 ENCOUNTER — Other Ambulatory Visit: Payer: Self-pay | Admitting: Family

## 2018-11-13 ENCOUNTER — Other Ambulatory Visit: Payer: Self-pay | Admitting: Family

## 2018-11-13 DIAGNOSIS — Z8679 Personal history of other diseases of the circulatory system: Secondary | ICD-10-CM

## 2018-11-13 DIAGNOSIS — Z8673 Personal history of transient ischemic attack (TIA), and cerebral infarction without residual deficits: Secondary | ICD-10-CM

## 2018-12-24 ENCOUNTER — Other Ambulatory Visit: Payer: Self-pay | Admitting: Family

## 2018-12-24 DIAGNOSIS — K219 Gastro-esophageal reflux disease without esophagitis: Secondary | ICD-10-CM

## 2018-12-31 ENCOUNTER — Other Ambulatory Visit: Payer: Self-pay | Admitting: Family

## 2018-12-31 DIAGNOSIS — E785 Hyperlipidemia, unspecified: Secondary | ICD-10-CM

## 2019-01-30 ENCOUNTER — Telehealth: Payer: Self-pay | Admitting: Family

## 2019-01-30 DIAGNOSIS — K219 Gastro-esophageal reflux disease without esophagitis: Secondary | ICD-10-CM

## 2019-01-30 DIAGNOSIS — E785 Hyperlipidemia, unspecified: Secondary | ICD-10-CM

## 2019-01-30 NOTE — Telephone Encounter (Signed)
Please advise 

## 2019-01-31 MED ORDER — ATORVASTATIN CALCIUM 40 MG PO TABS: ORAL_TABLET | ORAL | 0 refills | Status: DC

## 2019-01-31 MED ORDER — LEVOTHYROXINE SODIUM 25 MCG PO TABS: 25.0000 ug | ORAL_TABLET | Freq: Every day | ORAL | 1 refills | Status: DC

## 2019-01-31 MED ORDER — OMEPRAZOLE 40 MG PO CPDR: DELAYED_RELEASE_CAPSULE | ORAL | 0 refills | Status: DC

## 2019-01-31 NOTE — Telephone Encounter (Signed)
Pt aware.

## 2019-01-31 NOTE — Telephone Encounter (Signed)
I have sent in refills for the next 3 months. Should be fine at that time.

## 2019-02-01 ENCOUNTER — Ambulatory Visit: Payer: BC Managed Care – PPO | Admitting: Family

## 2019-02-18 ENCOUNTER — Other Ambulatory Visit: Payer: Self-pay | Admitting: Family

## 2019-02-18 DIAGNOSIS — Z8679 Personal history of other diseases of the circulatory system: Secondary | ICD-10-CM

## 2019-02-18 DIAGNOSIS — Z8673 Personal history of transient ischemic attack (TIA), and cerebral infarction without residual deficits: Secondary | ICD-10-CM

## 2019-03-19 ENCOUNTER — Other Ambulatory Visit: Payer: Self-pay | Admitting: Family

## 2019-03-19 DIAGNOSIS — Z8679 Personal history of other diseases of the circulatory system: Secondary | ICD-10-CM

## 2019-03-19 DIAGNOSIS — Z8673 Personal history of transient ischemic attack (TIA), and cerebral infarction without residual deficits: Secondary | ICD-10-CM

## 2019-03-22 ENCOUNTER — Other Ambulatory Visit: Payer: Self-pay

## 2019-03-22 ENCOUNTER — Ambulatory Visit (INDEPENDENT_AMBULATORY_CARE_PROVIDER_SITE_OTHER): Payer: BC Managed Care – PPO | Admitting: Family

## 2019-03-22 ENCOUNTER — Encounter: Payer: Self-pay | Admitting: Family

## 2019-03-22 DIAGNOSIS — K219 Gastro-esophageal reflux disease without esophagitis: Secondary | ICD-10-CM

## 2019-03-22 DIAGNOSIS — E785 Hyperlipidemia, unspecified: Secondary | ICD-10-CM

## 2019-03-22 DIAGNOSIS — E663 Overweight: Secondary | ICD-10-CM

## 2019-03-22 DIAGNOSIS — Z8679 Personal history of other diseases of the circulatory system: Secondary | ICD-10-CM | POA: Diagnosis not present

## 2019-03-22 DIAGNOSIS — E039 Hypothyroidism, unspecified: Secondary | ICD-10-CM | POA: Diagnosis not present

## 2019-03-22 DIAGNOSIS — Z8673 Personal history of transient ischemic attack (TIA), and cerebral infarction without residual deficits: Secondary | ICD-10-CM

## 2019-03-22 MED ORDER — OMEPRAZOLE 40 MG PO CPDR: DELAYED_RELEASE_CAPSULE | ORAL | 0 refills | Status: DC

## 2019-03-22 MED ORDER — ASPIRIN-DIPYRIDAMOLE ER 25-200 MG PO CP12: ORAL_CAPSULE | ORAL | 1 refills | Status: DC

## 2019-03-22 MED ORDER — LEVOTHYROXINE SODIUM 25 MCG PO TABS: 25.0000 ug | ORAL_TABLET | Freq: Every day | ORAL | 1 refills | Status: DC

## 2019-03-22 MED ORDER — ATORVASTATIN CALCIUM 40 MG PO TABS: ORAL_TABLET | ORAL | 1 refills | Status: DC

## 2019-03-22 NOTE — Progress Notes (Signed)
Virtual Visit via telephone Note  I connected with Brian Briggs on 03/22/19 at 12:06 AM by telephone and verified that I am speaking with the correct person using two identifiers. Brian Briggs is currently located at work and no one is currently with her during visit. The provider, Jannifer Rodneyhristy Kal Chait, FNP is located in their office at time of visit.  I discussed the limitations, risks, security and privacy concerns of performing an evaluation and management service by telephone and the availability of in person appointments. I also discussed with the patient that there may be a patient responsible charge related to this service. The patient expressed understanding and agreed to proceed.   History and Present Illness:  Pt presents to the office today for chronic follow up.  Thyroid Problem  Presents for follow-up visit. Patient reports no constipation, diarrhea, dry skin or fatigue. The symptoms have been stable. His past medical history is significant for hyperlipidemia.  Gastroesophageal Reflux  He complains of belching and heartburn. This is a chronic problem. Pertinent negatives include no fatigue. He has tried a PPI for the symptoms. The treatment provided moderate relief.  Hyperlipidemia  This is a chronic problem. The current episode started more than 1 year ago. Current antihyperlipidemic treatment includes statins. The current treatment provides moderate improvement of lipids. Risk factors for coronary artery disease include dyslipidemia, male sex and a sedentary lifestyle.      Review of Systems  Constitutional: Negative for fatigue.  Gastrointestinal: Positive for heartburn. Negative for constipation and diarrhea.  All other systems reviewed and are negative.    Observations/Objective: No SOB or distress noted  Assessment and Plan: Brian Briggs comes in today with chief complaint of No chief complaint on file.   Diagnosis and orders addressed:  1.  Gastroesophageal reflux disease, esophagitis presence not specified -Diet discussed- Avoid fried, spicy, citrus foods, caffeine and alcohol -Do not eat 2-3 hours before bedtime -Encouraged small frequent meals -Avoid NSAID's - omeprazole (PRILOSEC) 40 MG capsule; TAKE (1) CAPSULE DAILY  Dispense: 90 capsule; Refill: 0  2. History of cardiovascular disorder - dipyridamole-aspirin (AGGRENOX) 200-25 MG 12hr capsule; TAKE (1) CAPSULE TWICE DAILY.  Dispense: 60 capsule; Refill: 1  3. Hyperlipidemia, unspecified hyperlipidemia type - atorvastatin (LIPITOR) 40 MG tablet; TAKE 1 TABLET ONCE DAILY AT 6PM  Dispense: 90 tablet; Refill: 1  4. Hypothyroidism, unspecified type - levothyroxine (SYNTHROID) 25 MCG tablet; Take 1 tablet (25 mcg total) by mouth daily before breakfast.  Dispense: 90 tablet; Refill: 1  5. Overweight (BMI 25.0-29.9)  6. History of CVA (cerebrovascular accident) - dipyridamole-aspirin (AGGRENOX) 200-25 MG 12hr capsule; TAKE (1) CAPSULE TWICE DAILY.  Dispense: 60 capsule; Refill: 1   Labs pending Health Maintenance reviewed Diet and exercise encouraged  Follow up plan: 2 months for lab work     I discussed the assessment and treatment plan with the patient. The patient was provided an opportunity to ask questions and all were answered. The patient agreed with the plan and demonstrated an understanding of the instructions.   The patient was advised to call back or seek an in-person evaluation if the symptoms worsen or if the condition fails to improve as anticipated.  The above assessment and management plan was discussed with the patient. The patient verbalized understanding of and has agreed to the management plan. Patient is aware to call the clinic if symptoms persist or worsen. Patient is aware when to return to the clinic for a follow-up visit. Patient educated on  when it is appropriate to go to the emergency department.   Time call ended:  12:17 pm  I provided  11 minutes of non-face-to-face time during this encounter.    Jannifer Rodney, FNP

## 2019-04-22 ENCOUNTER — Telehealth: Payer: Self-pay

## 2019-04-22 NOTE — Telephone Encounter (Signed)
Spoke with patient regarding screening colonoscopy.  Patient states he would like to wait until winter because he works in [employer].  I told him we would send him a reminder at that time.  Peter Congo, [place]

## 2019-05-20 ENCOUNTER — Other Ambulatory Visit: Payer: Self-pay | Admitting: Family

## 2019-05-20 DIAGNOSIS — Z8679 Personal history of other diseases of the circulatory system: Secondary | ICD-10-CM

## 2019-05-20 DIAGNOSIS — Z8673 Personal history of transient ischemic attack (TIA), and cerebral infarction without residual deficits: Secondary | ICD-10-CM

## 2019-05-24 ENCOUNTER — Other Ambulatory Visit: Payer: Self-pay

## 2019-05-27 ENCOUNTER — Encounter: Payer: Self-pay | Admitting: Family

## 2019-05-27 ENCOUNTER — Ambulatory Visit: Payer: BC Managed Care – PPO | Admitting: Family

## 2019-05-27 ENCOUNTER — Other Ambulatory Visit: Payer: Self-pay

## 2019-05-27 VITALS — BP 127/78 | HR 53 | Temp 96.4°F | Ht 71.0 in | Wt 181.4 lb

## 2019-05-27 DIAGNOSIS — E039 Hypothyroidism, unspecified: Secondary | ICD-10-CM

## 2019-05-27 DIAGNOSIS — E785 Hyperlipidemia, unspecified: Secondary | ICD-10-CM

## 2019-05-27 DIAGNOSIS — K219 Gastro-esophageal reflux disease without esophagitis: Secondary | ICD-10-CM

## 2019-05-27 DIAGNOSIS — E663 Overweight: Secondary | ICD-10-CM

## 2019-05-27 DIAGNOSIS — Z8639 Personal history of other endocrine, nutritional and metabolic disease: Secondary | ICD-10-CM

## 2019-05-27 DIAGNOSIS — Z0001 Encounter for general adult medical examination with abnormal findings: Secondary | ICD-10-CM | POA: Diagnosis not present

## 2019-05-27 DIAGNOSIS — Z8679 Personal history of other diseases of the circulatory system: Secondary | ICD-10-CM

## 2019-05-27 DIAGNOSIS — Z0000 Encounter for general adult medical examination without abnormal findings: Secondary | ICD-10-CM

## 2019-05-27 DIAGNOSIS — Z8673 Personal history of transient ischemic attack (TIA), and cerebral infarction without residual deficits: Secondary | ICD-10-CM

## 2019-05-27 NOTE — Progress Notes (Signed)
Subjective:    Patient ID: Brian Briggs, male    DOB: 23-Oct-1961, 58 y.o.   MRN: [id number]  Chief Complaint  Patient presents with  . Gastroesophageal Reflux    2 mos ckup  . Hyperlipidemia  . Hypothyroidism   PT presents to the office today for CPE. Marland Kitchen  Thyroid Problem Presents for follow-up visit. Patient reports no constipation, diarrhea, dry skin, fatigue or hoarse voice. The symptoms have been stable. His past medical history is significant for hyperlipidemia.  Gastroesophageal Reflux He reports no belching, no dysphagia or no hoarse voice. This is a chronic problem. The current episode started more than 1 year ago. The problem occurs occasionally. Pertinent negatives include no fatigue. He has tried a PPI for the symptoms. The treatment provided moderate relief.  Hyperlipidemia This is a chronic problem. The current episode started more than 1 year ago. The problem is controlled. Recent lipid tests were reviewed and are normal. Current antihyperlipidemic treatment includes statins. The current treatment provides moderate improvement of lipids. Risk factors for coronary artery disease include dyslipidemia, male sex and a sedentary lifestyle.      Review of Systems  Constitutional: Negative for fatigue.  HENT: Negative for hoarse voice.   Gastrointestinal: Negative for constipation, diarrhea and dysphagia.  All other systems reviewed and are negative.      Objective:   Physical Exam Vitals signs reviewed.  Constitutional:      General: He is not in acute distress.    Appearance: He is well-developed.  HENT:     Head: Normocephalic.     Right Ear: Tympanic membrane normal.     Left Ear: Tympanic membrane normal.  Eyes:     General:        Right eye: No discharge.        Left eye: No discharge.     Pupils: Pupils are equal, round, and reactive to light.  Neck:     Musculoskeletal: Normal range of motion and neck supple.     Thyroid: No thyromegaly.   Cardiovascular:     Rate and Rhythm: Normal rate and regular rhythm.     Heart sounds: Normal heart sounds. No murmur.  Pulmonary:     Effort: Pulmonary effort is normal. No respiratory distress.     Breath sounds: Normal breath sounds. No wheezing.  Abdominal:     General: Bowel sounds are normal. There is no distension.     Palpations: Abdomen is soft.     Tenderness: There is no abdominal tenderness.  Musculoskeletal: Normal range of motion.        General: No tenderness.  Skin:    General: Skin is warm and dry.     Findings: No erythema or rash.  Neurological:     Mental Status: He is alert and oriented to person, place, and time.     Cranial Nerves: No cranial nerve deficit.     Deep Tendon Reflexes: Reflexes are normal and symmetric.  Psychiatric:        Behavior: Behavior normal.        Thought Content: Thought content normal.        Judgment: Judgment normal.       BP 127/78   Pulse (!) 53   Temp (!) 96.4 F (35.8 C) (Oral)   Ht 5' 11"  (1.803 m)   Wt 181 lb 6.4 oz (82.3 kg)   BMI 25.30 kg/m      Assessment & Plan:  Brian Briggs comes  in today with chief complaint of Gastroesophageal Reflux (2 mos ckup), Hyperlipidemia, and Hypothyroidism   Diagnosis and orders addressed:  1. Gastroesophageal reflux disease, esophagitis presence not specified - CMP14+EGFR - CBC with Differential/Platelet  2. Hypothyroidism, unspecified type - CMP14+EGFR - CBC with Differential/Platelet - TSH  3. Overweight (BMI 25.0-29.9) - CMP14+EGFR - CBC with Differential/Platelet  4. Hyperlipidemia, unspecified hyperlipidemia type - CMP14+EGFR - CBC with Differential/Platelet - Lipid panel  5. History of thyroid nodule - CMP14+EGFR - CBC with Differential/Platelet  6. History of CVA (cerebrovascular accident) - CMP14+EGFR - CBC with Differential/Platelet  7. History of cardiovascular disorder - CMP14+EGFR - CBC with Differential/Platelet  8. Annual physical exam  - CMP14+EGFR - CBC with Differential/Platelet - Lipid panel - TSH - PSA, total and free   Labs pending Health Maintenance reviewed Diet and exercise encouraged  Follow up plan: [place], FNP

## 2019-05-27 NOTE — Patient Instructions (Signed)

## 2019-05-28 LAB — CBC WITH DIFFERENTIAL/PLATELET
Basophils Absolute: 0 10*3/uL (ref 0.0–0.2)
Basos: 0 %
EOS (ABSOLUTE): 0.1 10*3/uL (ref 0.0–0.4)
Eos: 1 %
Hematocrit: 38.4 % (ref 37.5–51.0)
Hemoglobin: 13.9 g/dL (ref 13.0–17.7)
Immature Grans (Abs): 0 10*3/uL (ref 0.0–0.1)
Immature Granulocytes: 0 %
Lymphocytes Absolute: 1.8 10*3/uL (ref 0.7–3.1)
Lymphs: 39 %
MCH: 32 pg (ref 26.6–33.0)
MCHC: 36.2 g/dL — ABNORMAL HIGH (ref 31.5–35.7)
MCV: 88 fL (ref 79–97)
Monocytes Absolute: 0.3 10*3/uL (ref 0.1–0.9)
Monocytes: 7 %
Neutrophils Absolute: 2.5 10*3/uL (ref 1.4–7.0)
Neutrophils: 53 %
Platelets: 146 10*3/uL — ABNORMAL LOW (ref 150–450)
RBC: 4.35 x10E6/uL (ref 4.14–5.80)
RDW: 11.8 % (ref 11.6–15.4)
WBC: 4.7 10*3/uL (ref 3.4–10.8)

## 2019-05-28 LAB — LIPID PANEL
Chol/HDL Ratio: 3 ratio (ref 0.0–5.0)
Cholesterol, Total: 142 mg/dL (ref 100–199)
HDL: 48 mg/dL (ref >39)
LDL Calculated: 73 mg/dL (ref 0–99)
Triglycerides: 106 mg/dL (ref 0–149)
VLDL Cholesterol Cal: 21 mg/dL (ref 5–40)

## 2019-05-28 LAB — CMP14+EGFR
ALT: 13 IU/L (ref 0–44)
AST: 30 IU/L (ref 0–40)
Albumin/Globulin Ratio: 1.9 (ref 1.2–2.2)
Albumin: 4.3 g/dL (ref 3.8–4.9)
Alkaline Phosphatase: 73 IU/L (ref 39–117)
BUN/Creatinine Ratio: 16 (ref 10–24)
BUN: 16 mg/dL (ref 8–27)
Bilirubin Total: 0.6 mg/dL (ref 0.0–1.2)
CO2: 22 mmol/L (ref 20–29)
Calcium: 9 mg/dL (ref 8.6–10.2)
Chloride: 107 mmol/L — ABNORMAL HIGH (ref 96–106)
Creatinine, Ser: 0.99 mg/dL (ref 0.76–1.27)
GFR calc Af Amer: 95 mL/min/{1.73_m2} (ref >59)
GFR calc non Af Amer: 82 mL/min/{1.73_m2} (ref >59)
Globulin, Total: 2.3 g/dL (ref 1.5–4.5)
Glucose: 98 mg/dL (ref 65–99)
Potassium: 4.4 mmol/L (ref 3.5–5.2)
Sodium: 145 mmol/L — ABNORMAL HIGH (ref 134–144)
Total Protein: 6.6 g/dL (ref 6.0–8.5)

## 2019-05-28 LAB — PSA, TOTAL AND FREE
PSA, Free Pct: 60 %
PSA, Free: 0.3 ng/mL
Prostate Specific Ag, Serum: 0.5 ng/mL (ref 0.0–4.0)

## 2019-05-28 LAB — TSH: TSH: 3.91 u[IU]/mL (ref 0.450–4.500)

## 2019-06-24 ENCOUNTER — Other Ambulatory Visit: Payer: Self-pay | Admitting: Family

## 2019-06-24 DIAGNOSIS — Z8673 Personal history of transient ischemic attack (TIA), and cerebral infarction without residual deficits: Secondary | ICD-10-CM

## 2019-06-24 DIAGNOSIS — Z8679 Personal history of other diseases of the circulatory system: Secondary | ICD-10-CM

## 2019-08-22 ENCOUNTER — Encounter: Payer: Self-pay | Admitting: Gastroenterology

## 2019-08-28 ENCOUNTER — Encounter: Payer: Self-pay | Admitting: Gastroenterology

## 2019-09-24 ENCOUNTER — Other Ambulatory Visit: Payer: Self-pay | Admitting: Family

## 2019-09-24 DIAGNOSIS — K219 Gastro-esophageal reflux disease without esophagitis: Secondary | ICD-10-CM

## 2019-09-26 ENCOUNTER — Other Ambulatory Visit: Payer: Self-pay | Admitting: Family

## 2019-10-14 ENCOUNTER — Encounter: Payer: Self-pay | Admitting: Gastroenterology

## 2019-11-04 ENCOUNTER — Other Ambulatory Visit: Payer: Self-pay

## 2019-11-04 ENCOUNTER — Encounter: Payer: Self-pay | Admitting: Gastroenterology

## 2019-11-04 ENCOUNTER — Ambulatory Visit (AMBULATORY_SURGERY_CENTER): Payer: BC Managed Care – PPO | Admitting: *Deleted

## 2019-11-04 VITALS — Temp 96.6°F | Ht 71.0 in | Wt 190.0 lb

## 2019-11-04 DIAGNOSIS — Z1211 Encounter for screening for malignant neoplasm of colon: Secondary | ICD-10-CM

## 2019-11-04 DIAGNOSIS — Z1159 Encounter for screening for other viral diseases: Secondary | ICD-10-CM

## 2019-11-04 MED ORDER — NA SULFATE-K SULFATE-MG SULF 17.5-3.13-1.6 GM/177ML PO SOLN: ORAL | 0 refills | Status: DC

## 2019-11-04 NOTE — Progress Notes (Signed)
Patient is here in-person for PV. Patient denies any allergies to eggs or soy. Patient denies any problems with anesthesia/sedation. Patient denies any oxygen use at home. Patient denies taking any diet/weight loss medications or blood thinners. Patient is not being treated for MRSA or C-diff. EMMI education assisgned to the patient for the procedure, this was explained and instructions given to patient. COVID-19 screening test is on 12/30, the pt is aware. Pt is aware that care partner will wait in the car during procedure; if they feel like they will be too hot or cold to wait in the car; they may wait in the 4 th floor lobby. Patient is aware to bring only one care partner. We want them to wear a mask (we do not have any that we can provide them), practice social distancing, and we will check their temperatures when they get here.  I did remind the patient that their care partner needs to stay in the parking lot the entire time and have a cell phone available, we will call them when the pt is ready for discharge. Patient will wear mask into building.    Suprep $15 off coupon given to the patient. 

## 2019-11-13 ENCOUNTER — Other Ambulatory Visit: Payer: Self-pay | Admitting: Gastroenterology

## 2019-11-13 ENCOUNTER — Ambulatory Visit (INDEPENDENT_AMBULATORY_CARE_PROVIDER_SITE_OTHER): Payer: BC Managed Care – PPO

## 2019-11-13 DIAGNOSIS — Z1159 Encounter for screening for other viral diseases: Secondary | ICD-10-CM

## 2019-11-13 LAB — SARS CORONAVIRUS 2 (TAT 6-24 HRS): SARS Coronavirus 2: NEGATIVE

## 2019-11-18 ENCOUNTER — Other Ambulatory Visit: Payer: Self-pay

## 2019-11-18 ENCOUNTER — Encounter: Payer: Self-pay | Admitting: Gastroenterology

## 2019-11-18 ENCOUNTER — Ambulatory Visit (AMBULATORY_SURGERY_CENTER): Payer: BC Managed Care – PPO | Admitting: Gastroenterology

## 2019-11-18 VITALS — BP 122/74 | HR 52 | Temp 98.5°F | Resp 13 | Ht 71.0 in | Wt 190.0 lb

## 2019-11-18 DIAGNOSIS — Z1211 Encounter for screening for malignant neoplasm of colon: Secondary | ICD-10-CM

## 2019-11-18 MED ORDER — SODIUM CHLORIDE 0.9 % IV SOLN: 500.0000 mL | Freq: Once | INTRAVENOUS | Status: DC

## 2019-11-18 NOTE — Progress Notes (Signed)
Temp by YF, vitals by DT.  Pt's states no medical or surgical changes since previsit or office visit.

## 2019-11-18 NOTE — Patient Instructions (Signed)
Handout given for hemorrhoids.  Resume Aggrenox TODAY at previous dose.  YOU HAD AN ENDOSCOPIC PROCEDURE TODAY AT THE  ENDOSCOPY CENTER:   Refer to the procedure report that was given to you for any specific questions about what was found during the examination.  If the procedure report does not answer your questions, please call your gastroenterologist to clarify.  If you requested that your care partner not be given the details of your procedure findings, then the procedure report has been included in a sealed envelope for you to review at your convenience later.  YOU SHOULD EXPECT: Some feelings of bloating in the abdomen. Passage of more gas than usual.  Walking can help get rid of the air that was put into your GI tract during the procedure and reduce the bloating. If you had a lower endoscopy (such as a colonoscopy or flexible sigmoidoscopy) you may notice spotting of blood in your stool or on the toilet paper. If you underwent a bowel prep for your procedure, you may not have a normal bowel movement for a few days.  Please Note:  You might notice some irritation and congestion in your nose or some drainage.  This is from the oxygen used during your procedure.  There is no need for concern and it should clear up in a day or so.  SYMPTOMS TO REPORT IMMEDIATELY:   Following lower endoscopy (colonoscopy or flexible sigmoidoscopy):  Excessive amounts of blood in the stool  Significant tenderness or worsening of abdominal pains  Swelling of the abdomen that is new, acute  Fever of 100F or higher  For urgent or emergent issues, a gastroenterologist can be reached at any hour by calling [phone].  DIET:  We do recommend a small meal at first, but then you may proceed to your regular diet.  Drink plenty of fluids but you should avoid alcoholic beverages for 24 hours.  ACTIVITY:  You should plan to take it easy for the rest of today and you should NOT DRIVE or use heavy machinery  until tomorrow (because of the sedation medicines used during the test).    FOLLOW UP: Our staff will call the number listed on your records 48-72 hours following your procedure to check on you and address any questions or concerns that you may have regarding the information given to you following your procedure. If we do not reach you, we will leave a message.  We will attempt to reach you two times.  During this call, we will ask if you have developed any symptoms of COVID 19. If you develop any symptoms (ie: fever, flu-like symptoms, shortness of breath, cough etc.) before then, please call [phone].  If you test positive for Covid 19 in the 2 weeks post procedure, please call and report this information to [place].    If any biopsies were taken you will be contacted by phone or by letter within the next 1-3 weeks.  Please call us at [phone] if you have not heard about the biopsies in 3 weeks.    SIGNATURES/CONFIDENTIALITY: You and/or your care partner have signed paperwork which will be entered into your electronic medical record.  These signatures attest to the fact that that the information above on your After Visit Summary has been reviewed and is understood.  Full responsibility of the confidentiality of this discharge information lies with you and/or your care-partner.

## 2019-11-18 NOTE — Progress Notes (Signed)
To PACU, VSS. Report to [email or web address] 

## 2019-11-18 NOTE — Op Note (Signed)
[facility] Patient Name: Brian Briggs Procedure Date: 11/18/2019 7:57 AM MRN: [id number] Endoscopist: Sherilyn Cooter L. Myrtie Neither , MD Age: 59 Referring MD:  Date of Birth: 12/18/1960 Gender: Male Account #: [id number] Procedure:                Colonoscopy Indications:              Screening for colorectal malignant neoplasm ( no                            polyps 04/2009) Medicines:                Monitored Anesthesia Care Procedure:                Pre-Anesthesia Assessment:                           - Prior to the procedure, a History and Physical                            was performed, and patient medications and                            allergies were reviewed. The patient's tolerance of                            previous anesthesia was also reviewed. The risks                            and benefits of the procedure and the sedation                            options and risks were discussed with the patient.                            All questions were answered, and informed consent                            was obtained. Prior Anticoagulants: The patient has                            taken no previous anticoagulant or antiplatelet                            agents except for aspirin. ASA Grade Assessment:                            III - A patient with severe systemic disease. After                            reviewing the risks and benefits, the patient was                            deemed in satisfactory condition to undergo the  procedure.                           After obtaining informed consent, the colonoscope                            was passed under direct vision. Throughout the                            procedure, the patient's blood pressure, pulse, and                            oxygen saturations were monitored continuously. The                            Colonoscope was introduced through the anus and                             advanced to the the cecum, identified by                            appendiceal orifice and ileocecal valve. The                            colonoscopy was performed without difficulty. The                            patient tolerated the procedure well. The quality                            of the bowel preparation was excellent. The                            ileocecal valve, appendiceal orifice, and rectum                            were photographed. Scope In: 8:04:14 AM Scope Out: 8:18:03 AM Scope Withdrawal Time: 0 hours 9 minutes 42 seconds  Total Procedure Duration: 0 hours 13 minutes 49 seconds  Findings:                 The perianal and digital rectal examinations were                            normal.                           Internal hemorrhoids were found.                           The exam was otherwise without abnormality on                            direct and retroflexion views. Complications:            No immediate complications. Estimated Blood Loss:     Estimated blood loss: none. Impression:               -  Internal hemorrhoids.                           - The examination was otherwise normal on direct                            and retroflexion views.                           - No specimens collected. Recommendation:           - Patient has a contact number available for                            emergencies. The signs and symptoms of potential                            delayed complications were discussed with the                            patient. Return to normal activities tomorrow.                            Written discharge instructions were provided to the                            patient.                           - Resume previous diet.                           - Continue present medications.                           - Repeat colonoscopy in 10 years for screening                            purposes. Nea Gittens L. Loletha Carrow, MD 11/18/2019 8:22:10  AM This report has been signed electronically.

## 2019-11-20 ENCOUNTER — Telehealth: Payer: Self-pay

## 2019-11-20 NOTE — Telephone Encounter (Signed)
  Follow up Call-  Call back number 11/18/2019  Post procedure Call Back phone  # [phone]  Permission to leave phone message Yes  Some recent data might be hidden     Patient questions:  Do you have a fever, pain , or abdominal swelling? No. Pain Score  0 *  Have you tolerated food without any problems? Yes.    Have you been able to return to your normal activities? Yes.    Do you have any questions about your discharge instructions: Diet   No. Medications  No. Follow up visit  No.  Do you have questions or concerns about your Care? No.  Actions: * If pain score is 4 or above: No action needed, pain <4.  1. Have you developed a fever since your procedure? no  2.   Have you had an respiratory symptoms (SOB or cough) since your procedure? no  3.   Have you tested positive for COVID 19 since your procedure no  4.   Have you had any family members/close contacts diagnosed with the COVID 19 since your procedure?  no   If yes to any of these questions please route to Laverna Peace, RN and Jennye Boroughs, Charity fundraiser.

## 2019-11-20 NOTE — Telephone Encounter (Signed)
  Follow up Call-  Call back number 11/18/2019  Post procedure Call Back phone  # [phone]  Permission to leave phone message Yes  Some recent data might be hidden     Left  message

## 2019-11-26 ENCOUNTER — Other Ambulatory Visit: Payer: Self-pay

## 2019-11-27 ENCOUNTER — Ambulatory Visit (INDEPENDENT_AMBULATORY_CARE_PROVIDER_SITE_OTHER): Payer: BC Managed Care – PPO | Admitting: Family

## 2019-11-27 ENCOUNTER — Other Ambulatory Visit: Payer: Self-pay

## 2019-11-27 ENCOUNTER — Encounter: Payer: Self-pay | Admitting: Family

## 2019-11-27 VITALS — BP 116/73 | HR 57 | Temp 97.4°F | Ht 70.0 in | Wt 188.0 lb

## 2019-11-27 DIAGNOSIS — Z8673 Personal history of transient ischemic attack (TIA), and cerebral infarction without residual deficits: Secondary | ICD-10-CM | POA: Diagnosis not present

## 2019-11-27 DIAGNOSIS — K219 Gastro-esophageal reflux disease without esophagitis: Secondary | ICD-10-CM | POA: Diagnosis not present

## 2019-11-27 DIAGNOSIS — Z8679 Personal history of other diseases of the circulatory system: Secondary | ICD-10-CM

## 2019-11-27 DIAGNOSIS — E785 Hyperlipidemia, unspecified: Secondary | ICD-10-CM

## 2019-11-27 DIAGNOSIS — E663 Overweight: Secondary | ICD-10-CM

## 2019-11-27 DIAGNOSIS — E039 Hypothyroidism, unspecified: Secondary | ICD-10-CM

## 2019-11-27 NOTE — Patient Instructions (Signed)

## 2019-11-27 NOTE — Progress Notes (Signed)
Subjective:    Patient ID: Brian Briggs, male    DOB: [birth year], 59 y.o.   MRN: [id number]  Chief Complaint  Patient presents with  . Medical Management of Chronic Issues   Pt presents to the office today for chronic follow up.  Gastroesophageal Reflux He complains of belching and heartburn. He reports no coughing or no dysphagia. This is a chronic problem. The current episode started more than 1 year ago. The problem occurs rarely. Pertinent negatives include no fatigue. He has tried a PPI for the symptoms. The treatment provided moderate relief.  Thyroid Problem Presents for follow-up visit. Patient reports no constipation, depressed mood, diarrhea or fatigue. The symptoms have been stable. His past medical history is significant for hyperlipidemia.  Hyperlipidemia This is a chronic problem. The current episode started more than 1 year ago. The problem is controlled. Recent lipid tests were reviewed and are normal. Exacerbating diseases include hypothyroidism. The current treatment provides moderate improvement of lipids. Risk factors for coronary artery disease include male sex and a sedentary lifestyle.      Review of Systems  Constitutional: Negative for fatigue.  Respiratory: Negative for cough.   Gastrointestinal: Positive for heartburn. Negative for constipation, diarrhea and dysphagia.  All other systems reviewed and are negative.      Objective:   Physical Exam Vitals reviewed.  Constitutional:      General: He is not in acute distress.    Appearance: He is well-developed.  HENT:     Head: Normocephalic.     Right Ear: Tympanic membrane normal.     Left Ear: Tympanic membrane normal.  Eyes:     General:        Right eye: No discharge.        Left eye: No discharge.     Pupils: Pupils are equal, round, and reactive to light.  Neck:     Thyroid: No thyromegaly.  Cardiovascular:     Rate and Rhythm: Normal rate and regular rhythm.     Heart sounds: Normal  heart sounds. No murmur.  Pulmonary:     Effort: Pulmonary effort is normal. No respiratory distress.     Breath sounds: Normal breath sounds. No wheezing.  Abdominal:     General: Bowel sounds are normal. There is no distension.     Palpations: Abdomen is soft.     Tenderness: There is no abdominal tenderness.  Musculoskeletal:        General: No tenderness. Normal range of motion.     Cervical back: Normal range of motion and neck supple.  Skin:    General: Skin is warm and dry.     Findings: No erythema or rash.  Neurological:     Mental Status: He is alert and oriented to person, place, and time.     Cranial Nerves: No cranial nerve deficit.     Deep Tendon Reflexes: Reflexes are normal and symmetric.  Psychiatric:        Behavior: Behavior normal.        Thought Content: Thought content normal.        Judgment: Judgment normal.       BP 116/73   Pulse (!) 57   Temp (!) 97.4 F (36.3 C) (Oral)   Ht 5' 10"  (1.778 m)   Wt 188 lb (85.3 kg)   BMI 26.98 kg/m      Assessment & Plan:  Brian Briggs comes in today with chief complaint of Medical Management of  Chronic Issues   Diagnosis and orders addressed:  1. Gastroesophageal reflux disease, unspecified whether esophagitis present - CMP14+EGFR - CBC with Differential/Platelet  2. Hyperlipidemia, unspecified hyperlipidemia type - CMP14+EGFR - CBC with Differential/Platelet  3. History of cardiovascular disorder - CMP14+EGFR - CBC with Differential/Platelet  4. History of CVA (cerebrovascular accident) - CMP14+EGFR - CBC with Differential/Platelet  5. Overweight (BMI 25.0-29.9) - CMP14+EGFR - CBC with Differential/Platelet  6. Hypothyroidism, unspecified type - CMP14+EGFR - CBC with Differential/Platelet - TSH   Labs pending Health Maintenance reviewed Diet and exercise encouraged  Follow up plan: 6 months    Evelina Dun, FNP

## 2019-11-28 LAB — CBC WITH DIFFERENTIAL/PLATELET
Basophils Absolute: 0 10*3/uL (ref 0.0–0.2)
Basos: 1 %
EOS (ABSOLUTE): 0.1 10*3/uL (ref 0.0–0.4)
Eos: 1 %
Hematocrit: 39.8 % (ref 37.5–51.0)
Hemoglobin: 13.8 g/dL (ref 13.0–17.7)
Immature Grans (Abs): 0 10*3/uL (ref 0.0–0.1)
Immature Granulocytes: 0 %
Lymphocytes Absolute: 1.8 10*3/uL (ref 0.7–3.1)
Lymphs: 44 %
MCH: 31.6 pg (ref 26.6–33.0)
MCHC: 34.7 g/dL (ref 31.5–35.7)
MCV: 91 fL (ref 79–97)
Monocytes Absolute: 0.3 10*3/uL (ref 0.1–0.9)
Monocytes: 8 %
Neutrophils Absolute: 1.9 10*3/uL (ref 1.4–7.0)
Neutrophils: 46 %
Platelets: 179 10*3/uL (ref 150–450)
RBC: 4.37 x10E6/uL (ref 4.14–5.80)
RDW: 12.7 % (ref 11.6–15.4)
WBC: 4.2 10*3/uL (ref 3.4–10.8)

## 2019-11-28 LAB — CMP14+EGFR
ALT: 11 IU/L (ref 0–44)
AST: 28 IU/L (ref 0–40)
Albumin/Globulin Ratio: 2.3 — ABNORMAL HIGH (ref 1.2–2.2)
Albumin: 4.5 g/dL (ref 3.8–4.9)
Alkaline Phosphatase: 83 IU/L (ref 39–117)
BUN/Creatinine Ratio: 14 (ref 10–24)
BUN: 15 mg/dL (ref 8–27)
Bilirubin Total: 0.7 mg/dL (ref 0.0–1.2)
CO2: 23 mmol/L (ref 20–29)
Calcium: 9.5 mg/dL (ref 8.6–10.2)
Chloride: 103 mmol/L (ref 96–106)
Creatinine, Ser: 1.11 mg/dL (ref 0.76–1.27)
GFR calc Af Amer: 83 mL/min/{1.73_m2} (ref >59)
GFR calc non Af Amer: 72 mL/min/{1.73_m2} (ref >59)
Globulin, Total: 2 g/dL (ref 1.5–4.5)
Glucose: 98 mg/dL (ref 65–99)
Potassium: 4.6 mmol/L (ref 3.5–5.2)
Sodium: 141 mmol/L (ref 134–144)
Total Protein: 6.5 g/dL (ref 6.0–8.5)

## 2019-11-28 LAB — TSH: TSH: 3.42 u[IU]/mL (ref 0.450–4.500)

## 2019-12-17 ENCOUNTER — Other Ambulatory Visit: Payer: Self-pay | Admitting: Family

## 2019-12-17 DIAGNOSIS — K219 Gastro-esophageal reflux disease without esophagitis: Secondary | ICD-10-CM

## 2019-12-26 ENCOUNTER — Other Ambulatory Visit: Payer: Self-pay | Admitting: Family

## 2019-12-26 DIAGNOSIS — E785 Hyperlipidemia, unspecified: Secondary | ICD-10-CM

## 2019-12-30 ENCOUNTER — Other Ambulatory Visit: Payer: Self-pay | Admitting: Family

## 2019-12-30 DIAGNOSIS — Z8673 Personal history of transient ischemic attack (TIA), and cerebral infarction without residual deficits: Secondary | ICD-10-CM

## 2019-12-30 DIAGNOSIS — Z8679 Personal history of other diseases of the circulatory system: Secondary | ICD-10-CM

## 2020-01-28 ENCOUNTER — Other Ambulatory Visit: Payer: Self-pay | Admitting: Family

## 2020-01-28 DIAGNOSIS — E039 Hypothyroidism, unspecified: Secondary | ICD-10-CM

## 2020-03-20 ENCOUNTER — Other Ambulatory Visit: Payer: Self-pay | Admitting: Family

## 2020-03-20 DIAGNOSIS — K219 Gastro-esophageal reflux disease without esophagitis: Secondary | ICD-10-CM

## 2020-04-07 ENCOUNTER — Other Ambulatory Visit: Payer: Self-pay | Admitting: Family

## 2020-04-07 DIAGNOSIS — Z8679 Personal history of other diseases of the circulatory system: Secondary | ICD-10-CM

## 2020-04-07 DIAGNOSIS — Z8673 Personal history of transient ischemic attack (TIA), and cerebral infarction without residual deficits: Secondary | ICD-10-CM

## 2020-04-16 ENCOUNTER — Other Ambulatory Visit: Payer: Self-pay | Admitting: Family

## 2020-04-16 DIAGNOSIS — E785 Hyperlipidemia, unspecified: Secondary | ICD-10-CM

## 2020-04-30 ENCOUNTER — Other Ambulatory Visit: Payer: Self-pay | Admitting: Family

## 2020-04-30 DIAGNOSIS — E039 Hypothyroidism, unspecified: Secondary | ICD-10-CM

## 2020-05-26 ENCOUNTER — Ambulatory Visit: Payer: BC Managed Care – PPO | Admitting: Family

## 2020-06-05 ENCOUNTER — Encounter: Payer: Self-pay | Admitting: Family

## 2020-06-05 ENCOUNTER — Ambulatory Visit: Payer: BC Managed Care – PPO | Admitting: Family

## 2020-06-05 ENCOUNTER — Other Ambulatory Visit: Payer: Self-pay

## 2020-06-05 ENCOUNTER — Other Ambulatory Visit: Payer: Self-pay | Admitting: Family

## 2020-06-05 VITALS — BP 110/66 | HR 57 | Temp 97.3°F | Ht 70.0 in | Wt 175.6 lb

## 2020-06-05 DIAGNOSIS — Z8679 Personal history of other diseases of the circulatory system: Secondary | ICD-10-CM

## 2020-06-05 DIAGNOSIS — Z8673 Personal history of transient ischemic attack (TIA), and cerebral infarction without residual deficits: Secondary | ICD-10-CM

## 2020-06-05 DIAGNOSIS — Z0000 Encounter for general adult medical examination without abnormal findings: Secondary | ICD-10-CM

## 2020-06-05 DIAGNOSIS — Z0001 Encounter for general adult medical examination with abnormal findings: Secondary | ICD-10-CM

## 2020-06-05 DIAGNOSIS — E785 Hyperlipidemia, unspecified: Secondary | ICD-10-CM

## 2020-06-05 DIAGNOSIS — K219 Gastro-esophageal reflux disease without esophagitis: Secondary | ICD-10-CM | POA: Diagnosis not present

## 2020-06-05 DIAGNOSIS — E663 Overweight: Secondary | ICD-10-CM | POA: Diagnosis not present

## 2020-06-05 DIAGNOSIS — E039 Hypothyroidism, unspecified: Secondary | ICD-10-CM

## 2020-06-05 MED ORDER — ATORVASTATIN CALCIUM 40 MG PO TABS: 40.0000 mg | ORAL_TABLET | Freq: Every day | ORAL | 3 refills | Status: DC

## 2020-06-05 MED ORDER — LEVOTHYROXINE SODIUM 25 MCG PO TABS: ORAL_TABLET | ORAL | 3 refills | Status: DC

## 2020-06-05 MED ORDER — OMEPRAZOLE 40 MG PO CPDR: 40.0000 mg | DELAYED_RELEASE_CAPSULE | Freq: Every day | ORAL | 3 refills | Status: DC

## 2020-06-05 MED ORDER — ASPIRIN-DIPYRIDAMOLE ER 25-200 MG PO CP12: 1.0000 | ORAL_CAPSULE | Freq: Two times a day (BID) | ORAL | 3 refills | Status: DC

## 2020-06-05 NOTE — Progress Notes (Signed)
Subjective:    Patient ID: Brian Briggs, male    DOB: 1961/01/15, 59 y.o.   MRN: [id number]  Chief Complaint  Patient presents with   Medical Management of Chronic Issues   Pt presents to the office today for CPE. He has a hx of CVA in 2007. Gastroesophageal Reflux He complains of belching and heartburn. This is a chronic problem. The current episode started more than 1 year ago. The problem occurs occasionally. The problem has been waxing and waning. The symptoms are aggravated by certain foods. He has tried a PPI for the symptoms. The treatment provided moderate relief.  Thyroid Problem Presents for follow-up visit. Patient reports no anxiety, diaphoresis or dry skin. The symptoms have been stable. His past medical history is significant for hyperlipidemia.  Hyperlipidemia This is a chronic problem. The current episode started more than 1 year ago. The problem is controlled. Recent lipid tests were reviewed and are normal. Exacerbating diseases include hypothyroidism. Current antihyperlipidemic treatment includes statins. The current treatment provides moderate improvement of lipids. Risk factors for coronary artery disease include dyslipidemia, male sex and a sedentary lifestyle.      Review of Systems  Constitutional: Negative for diaphoresis.  Gastrointestinal: Positive for heartburn.  Psychiatric/Behavioral: The patient is not nervous/anxious.   All other systems reviewed and are negative.  Family History  Problem Relation Age of Onset   Hyperlipidemia Mother    Prostate cancer Father    Hyperlipidemia Brother    Colon polyps Neg Hx    Colon cancer Neg Hx    Esophageal cancer Neg Hx    Rectal cancer Neg Hx    Stomach cancer Neg Hx    Social History   Socioeconomic History   Marital status: Married    Spouse name: Not on file   Number of children: 1   Years of education: 14   Highest education level: Not on file  Occupational History   Occupation:  [employer]  Tobacco Use   Smoking status: Never Smoker   Smokeless tobacco: Never Used  Scientific laboratory technician Use: Never used  Substance and Sexual Activity   Alcohol use: Yes    Alcohol/week: 3.0 standard drinks    Types: 3 Cans of beer per week   Drug use: No   Sexual activity: Not on file  Other Topics Concern   Not on file  Social History Narrative   Lives with wife and son   Caffeine use: daily (coffee/tea)   Right handed    Social Determinants of Health   Financial Resource Strain:    Difficulty of Paying Living Expenses:   Food Insecurity:    Worried About Charity fundraiser in the Last Year:    Arboriculturist in the Last Year:   Transportation Needs:    Film/video editor (Medical):    Lack of Transportation (Non-Medical):   Physical Activity:    Days of Exercise per Week:    Minutes of Exercise per Session:   Stress:    Feeling of Stress :   Social Connections:    Frequency of Communication with Friends and Family:    Frequency of Social Gatherings with Friends and Family:    Attends Religious Services:    Active Member of Clubs or Organizations:    Attends Archivist Meetings:    Marital Status:        Objective:   Physical Exam Vitals reviewed.  Constitutional:  General: He is not in acute distress.    Appearance: He is well-developed.  HENT:     Head: Normocephalic.     Right Ear: Tympanic membrane normal.     Left Ear: Tympanic membrane normal.  Eyes:     General:        Right eye: No discharge.        Left eye: No discharge.     Pupils: Pupils are equal, round, and reactive to light.  Neck:     Thyroid: No thyromegaly.  Cardiovascular:     Rate and Rhythm: Normal rate and regular rhythm.     Heart sounds: Normal heart sounds. No murmur heard.   Pulmonary:     Effort: Pulmonary effort is normal. No respiratory distress.     Breath sounds: Normal breath sounds. No wheezing.  Abdominal:     General: Bowel  sounds are normal. There is no distension.     Palpations: Abdomen is soft.     Tenderness: There is no abdominal tenderness.  Musculoskeletal:        General: No tenderness. Normal range of motion.     Cervical back: Normal range of motion and neck supple.  Skin:    General: Skin is warm and dry.     Findings: No erythema or rash.  Neurological:     Mental Status: He is alert and oriented to person, place, and time.     Cranial Nerves: No cranial nerve deficit.     Deep Tendon Reflexes: Reflexes are normal and symmetric.  Psychiatric:        Behavior: Behavior normal.        Thought Content: Thought content normal.        Judgment: Judgment normal.     EkG- Normal sinus  BP 99/66    Pulse 58    Temp (!) 97.3 F (36.3 C) (Temporal)    Ht 5' 10"  (1.778 m)    Wt 175 lb 9.6 oz (79.7 kg)    SpO2 100%    BMI 25.20 kg/m      Assessment & Plan:  Brian Briggs comes in today with chief complaint of Medical Management of Chronic Issues   Diagnosis and orders addressed:  1. Gastroesophageal reflux disease - omeprazole (PRILOSEC) 40 MG capsule; Take 1 capsule (40 mg total) by mouth daily.  Dispense: 90 capsule; Refill: 3 - CMP14+EGFR - CBC with Differential/Platelet  2. History of cardiovascular disorder - dipyridamole-aspirin (AGGRENOX) 200-25 MG 12hr capsule; Take 1 capsule by mouth 2 (two) times daily.  Dispense: 180 capsule; Refill: 3 - CMP14+EGFR - CBC with Differential/Platelet  3. History of CVA (cerebrovascular accident) - dipyridamole-aspirin (AGGRENOX) 200-25 MG 12hr capsule; Take 1 capsule by mouth 2 (two) times daily.  Dispense: 180 capsule; Refill: 3 - CMP14+EGFR - CBC with Differential/Platelet  4. Hyperlipidemia, unspecified hyperlipidemia type - atorvastatin (LIPITOR) 40 MG tablet; Take 1 tablet (40 mg total) by mouth daily.  Dispense: 90 tablet; Refill: 3 - CMP14+EGFR - CBC with Differential/Platelet - Lipid panel  5. Hypothyroidism, unspecified type -  levothyroxine (SYNTHROID) 25 MCG tablet; TAKE (1) TABLET DAILY BE- FORE BREAKFAST.  Dispense: 90 tablet; Refill: 3 - CMP14+EGFR - CBC with Differential/Platelet - TSH  6. Gastroesophageal reflux disease, unspecified whether esophagitis present - CMP14+EGFR - CBC with Differential/Platelet  7. Overweight (BMI 25.0-29.9) - CMP14+EGFR - CBC with Differential/Platelet  8. Annual physical exam - CMP14+EGFR - CBC with Differential/Platelet - Lipid panel - PSA, total and free -  TSH - EKG 12-Lead   Labs pending Health Maintenance reviewed Diet and exercise encouraged  Follow up plan: 1 year    Evelina Dun, FNP

## 2020-06-05 NOTE — Patient Instructions (Signed)

## 2020-06-06 LAB — LIPID PANEL
Chol/HDL Ratio: 2.4 ratio (ref 0.0–5.0)
Cholesterol, Total: 121 mg/dL (ref 100–199)
HDL: 50 mg/dL (ref >39)
LDL Chol Calc (NIH): 55 mg/dL (ref 0–99)
Triglycerides: 82 mg/dL (ref 0–149)
VLDL Cholesterol Cal: 16 mg/dL (ref 5–40)

## 2020-06-06 LAB — CMP14+EGFR
ALT: 12 IU/L (ref 0–44)
AST: 27 IU/L (ref 0–40)
Albumin/Globulin Ratio: 2 (ref 1.2–2.2)
Albumin: 4.3 g/dL (ref 3.8–4.8)
Alkaline Phosphatase: 77 IU/L (ref 48–121)
BUN/Creatinine Ratio: 13 (ref 10–24)
BUN: 14 mg/dL (ref 8–27)
Bilirubin Total: 0.9 mg/dL (ref 0.0–1.2)
CO2: 24 mmol/L (ref 20–29)
Calcium: 9.1 mg/dL (ref 8.6–10.2)
Chloride: 102 mmol/L (ref 96–106)
Creatinine, Ser: 1.07 mg/dL (ref 0.76–1.27)
GFR calc Af Amer: 86 mL/min/{1.73_m2} (ref >59)
GFR calc non Af Amer: 75 mL/min/{1.73_m2} (ref >59)
Globulin, Total: 2.1 g/dL (ref 1.5–4.5)
Glucose: 99 mg/dL (ref 65–99)
Potassium: 4.3 mmol/L (ref 3.5–5.2)
Sodium: 141 mmol/L (ref 134–144)
Total Protein: 6.4 g/dL (ref 6.0–8.5)

## 2020-06-06 LAB — CBC WITH DIFFERENTIAL/PLATELET
Basophils Absolute: 0 10*3/uL (ref 0.0–0.2)
Basos: 1 %
EOS (ABSOLUTE): 0 10*3/uL (ref 0.0–0.4)
Eos: 1 %
Hematocrit: 40.4 % (ref 37.5–51.0)
Hemoglobin: 14.2 g/dL (ref 13.0–17.7)
Immature Grans (Abs): 0 10*3/uL (ref 0.0–0.1)
Immature Granulocytes: 0 %
Lymphocytes Absolute: 1 10*3/uL (ref 0.7–3.1)
Lymphs: 24 %
MCH: 32.1 pg (ref 26.6–33.0)
MCHC: 35.1 g/dL (ref 31.5–35.7)
MCV: 91 fL (ref 79–97)
Monocytes Absolute: 0.4 10*3/uL (ref 0.1–0.9)
Monocytes: 9 %
Neutrophils Absolute: 2.5 10*3/uL (ref 1.4–7.0)
Neutrophils: 65 %
Platelets: 172 10*3/uL (ref 150–450)
RBC: 4.43 x10E6/uL (ref 4.14–5.80)
RDW: 11.3 % — ABNORMAL LOW (ref 11.6–15.4)
WBC: 3.9 10*3/uL (ref 3.4–10.8)

## 2020-06-06 LAB — PSA, TOTAL AND FREE
PSA, Free Pct: 66 %
PSA, Free: 0.33 ng/mL
Prostate Specific Ag, Serum: 0.5 ng/mL (ref 0.0–4.0)

## 2020-06-06 LAB — TSH: TSH: 2.86 u[IU]/mL (ref 0.450–4.500)

## 2020-12-04 ENCOUNTER — Encounter: Payer: Self-pay | Admitting: Family

## 2020-12-04 ENCOUNTER — Other Ambulatory Visit: Payer: Self-pay

## 2020-12-04 ENCOUNTER — Ambulatory Visit (INDEPENDENT_AMBULATORY_CARE_PROVIDER_SITE_OTHER): Payer: BC Managed Care – PPO | Admitting: Family

## 2020-12-04 VITALS — BP 133/78 | HR 87 | Temp 97.5°F | Ht 70.0 in | Wt 188.8 lb

## 2020-12-04 DIAGNOSIS — E039 Hypothyroidism, unspecified: Secondary | ICD-10-CM | POA: Diagnosis not present

## 2020-12-04 DIAGNOSIS — E663 Overweight: Secondary | ICD-10-CM

## 2020-12-04 DIAGNOSIS — E785 Hyperlipidemia, unspecified: Secondary | ICD-10-CM | POA: Diagnosis not present

## 2020-12-04 DIAGNOSIS — Z8679 Personal history of other diseases of the circulatory system: Secondary | ICD-10-CM

## 2020-12-04 DIAGNOSIS — Z8673 Personal history of transient ischemic attack (TIA), and cerebral infarction without residual deficits: Secondary | ICD-10-CM

## 2020-12-04 DIAGNOSIS — K219 Gastro-esophageal reflux disease without esophagitis: Secondary | ICD-10-CM | POA: Diagnosis not present

## 2020-12-04 NOTE — Patient Instructions (Signed)

## 2020-12-04 NOTE — Progress Notes (Signed)
Subjective:    Patient ID: Brian Briggs, male    DOB: [birth year], 60 y.o.   MRN: [id number]  Chief Complaint  Patient presents with  . Medical Management of Chronic Issues   PT presents to the office today for chronic follow up. He has a hx of CVA in 2007. Gastroesophageal Reflux He complains of belching and heartburn. He reports no hoarse voice. This is a chronic problem. The current episode started more than 1 year ago. The problem occurs occasionally. The problem has been waxing and waning. Pertinent negatives include no fatigue. He has tried a PPI for the symptoms. The treatment provided moderate relief.  Thyroid Problem Presents for follow-up visit. Patient reports no constipation, diarrhea, fatigue or hoarse voice. The symptoms have been stable. His past medical history is significant for hyperlipidemia.  Hyperlipidemia This is a chronic problem. The current episode started more than 1 year ago. Recent lipid tests were reviewed and are normal. Current antihyperlipidemic treatment includes statins. The current treatment provides moderate improvement of lipids. Risk factors for coronary artery disease include dyslipidemia, male sex, hypertension and a sedentary lifestyle.      Review of Systems  Constitutional: Negative for fatigue.  HENT: Negative for hoarse voice.   Gastrointestinal: Positive for heartburn. Negative for constipation and diarrhea.  All other systems reviewed and are negative.      Objective:   Physical Exam Vitals reviewed.  Constitutional:      General: He is not in acute distress.    Appearance: He is well-developed and well-nourished.  HENT:     Head: Normocephalic.     Right Ear: Tympanic membrane normal.     Left Ear: Tympanic membrane normal.     Mouth/Throat:     Mouth: Oropharynx is clear and moist.  Eyes:     General:        Right eye: No discharge.        Left eye: No discharge.     Pupils: Pupils are equal, round, and reactive to light.   Neck:     Thyroid: No thyromegaly.  Cardiovascular:     Rate and Rhythm: Normal rate and regular rhythm.     Pulses: Intact distal pulses.     Heart sounds: Normal heart sounds. No murmur heard.   Pulmonary:     Effort: Pulmonary effort is normal. No respiratory distress.     Breath sounds: Normal breath sounds. No wheezing.  Abdominal:     General: Bowel sounds are normal. There is no distension.     Palpations: Abdomen is soft.     Tenderness: There is no abdominal tenderness.  Musculoskeletal:        General: No tenderness or edema. Normal range of motion.     Cervical back: Normal range of motion and neck supple.  Skin:    General: Skin is warm and dry.     Findings: No erythema or rash.  Neurological:     Mental Status: He is alert and oriented to person, place, and time.     Cranial Nerves: No cranial nerve deficit.     Deep Tendon Reflexes: Reflexes are normal and symmetric.  Psychiatric:        Mood and Affect: Mood and affect normal.        Behavior: Behavior normal.        Thought Content: Thought content normal.        Judgment: Judgment normal.      BP 133/78  Pulse 87   Temp (!) 97.5 F (36.4 C) (Temporal)   Ht 5' 10"  (1.778 m)   Wt 188 lb 12.8 oz (85.6 kg)   BMI 27.09 kg/m       Assessment & Plan:  Brian Briggs comes in today with chief complaint of Medical Management of Chronic Issues   Diagnosis and orders addressed:  1. Gastroesophageal reflux disease, unspecified whether esophagitis present - CMP14+EGFR - CBC with Differential/Platelet  2. Hypothyroidism, unspecified type - CMP14+EGFR - CBC with Differential/Platelet - TSH  3. History of CVA (cerebrovascular accident) - CMP14+EGFR - CBC with Differential/Platelet  4. Hyperlipidemia, unspecified hyperlipidemia type - CMP14+EGFR - CBC with Differential/Platelet  5. Overweight (BMI 25.0-29.9) - CMP14+EGFR - CBC with Differential/Platelet  6. History of cardiovascular  disorder - CMP14+EGFR - CBC with Differential/Platelet   Labs pending Health Maintenance reviewed Diet and exercise encouraged  Follow up plan: 6 months    Evelina Dun, FNP

## 2020-12-05 LAB — CBC WITH DIFFERENTIAL/PLATELET
Basophils Absolute: 0 10*3/uL (ref 0.0–0.2)
Basos: 1 %
EOS (ABSOLUTE): 0.1 10*3/uL (ref 0.0–0.4)
Eos: 2 %
Hematocrit: 39 % (ref 37.5–51.0)
Hemoglobin: 13.9 g/dL (ref 13.0–17.7)
Immature Grans (Abs): 0 10*3/uL (ref 0.0–0.1)
Immature Granulocytes: 0 %
Lymphocytes Absolute: 1.6 10*3/uL (ref 0.7–3.1)
Lymphs: 39 %
MCH: 31.9 pg (ref 26.6–33.0)
MCHC: 35.6 g/dL (ref 31.5–35.7)
MCV: 89 fL (ref 79–97)
Monocytes Absolute: 0.4 10*3/uL (ref 0.1–0.9)
Monocytes: 10 %
Neutrophils Absolute: 2 10*3/uL (ref 1.4–7.0)
Neutrophils: 48 %
Platelets: 139 10*3/uL — ABNORMAL LOW (ref 150–450)
RBC: 4.36 x10E6/uL (ref 4.14–5.80)
RDW: 11.3 % — ABNORMAL LOW (ref 11.6–15.4)
WBC: 4.2 10*3/uL (ref 3.4–10.8)

## 2020-12-05 LAB — CMP14+EGFR
ALT: 10 IU/L (ref 0–44)
AST: 31 IU/L (ref 0–40)
Albumin/Globulin Ratio: 2.1 (ref 1.2–2.2)
Albumin: 4.4 g/dL (ref 3.8–4.8)
Alkaline Phosphatase: 76 IU/L (ref 44–121)
BUN/Creatinine Ratio: 16 (ref 10–24)
BUN: 19 mg/dL (ref 8–27)
Bilirubin Total: 0.8 mg/dL (ref 0.0–1.2)
CO2: 24 mmol/L (ref 20–29)
Calcium: 9.3 mg/dL (ref 8.6–10.2)
Chloride: 102 mmol/L (ref 96–106)
Creatinine, Ser: 1.16 mg/dL (ref 0.76–1.27)
GFR calc Af Amer: 78 mL/min/{1.73_m2} (ref >59)
GFR calc non Af Amer: 68 mL/min/{1.73_m2} (ref >59)
Globulin, Total: 2.1 g/dL (ref 1.5–4.5)
Glucose: 97 mg/dL (ref 65–99)
Potassium: 4.7 mmol/L (ref 3.5–5.2)
Sodium: 140 mmol/L (ref 134–144)
Total Protein: 6.5 g/dL (ref 6.0–8.5)

## 2020-12-05 LAB — TSH: TSH: 5.61 u[IU]/mL — ABNORMAL HIGH (ref 0.450–4.500)

## 2020-12-07 ENCOUNTER — Other Ambulatory Visit: Payer: Self-pay | Admitting: Family

## 2020-12-07 MED ORDER — LEVOTHYROXINE SODIUM 50 MCG PO TABS: 50.0000 ug | ORAL_TABLET | Freq: Every day | ORAL | 4 refills | Status: DC

## 2021-03-09 ENCOUNTER — Other Ambulatory Visit: Payer: Self-pay | Admitting: Family Medicine

## 2021-03-09 ENCOUNTER — Other Ambulatory Visit: Payer: BC Managed Care – PPO

## 2021-03-09 DIAGNOSIS — E039 Hypothyroidism, unspecified: Secondary | ICD-10-CM

## 2021-03-10 LAB — TSH: TSH: 2.25 u[IU]/mL (ref 0.450–4.500)

## 2021-06-01 ENCOUNTER — Other Ambulatory Visit: Payer: Self-pay | Admitting: Family

## 2021-06-01 DIAGNOSIS — K219 Gastro-esophageal reflux disease without esophagitis: Secondary | ICD-10-CM

## 2021-06-03 ENCOUNTER — Other Ambulatory Visit: Payer: Self-pay

## 2021-06-03 ENCOUNTER — Encounter: Payer: Self-pay | Admitting: Family

## 2021-06-03 ENCOUNTER — Ambulatory Visit: Payer: BC Managed Care – PPO | Admitting: Family

## 2021-06-03 VITALS — BP 120/75 | HR 59 | Temp 97.8°F | Ht 70.0 in | Wt 184.4 lb

## 2021-06-03 DIAGNOSIS — Z23 Encounter for immunization: Secondary | ICD-10-CM

## 2021-06-03 DIAGNOSIS — Z0000 Encounter for general adult medical examination without abnormal findings: Secondary | ICD-10-CM

## 2021-06-03 DIAGNOSIS — E785 Hyperlipidemia, unspecified: Secondary | ICD-10-CM | POA: Diagnosis not present

## 2021-06-03 DIAGNOSIS — Z0001 Encounter for general adult medical examination with abnormal findings: Secondary | ICD-10-CM

## 2021-06-03 DIAGNOSIS — E039 Hypothyroidism, unspecified: Secondary | ICD-10-CM

## 2021-06-03 DIAGNOSIS — Z8673 Personal history of transient ischemic attack (TIA), and cerebral infarction without residual deficits: Secondary | ICD-10-CM | POA: Diagnosis not present

## 2021-06-03 DIAGNOSIS — Z8679 Personal history of other diseases of the circulatory system: Secondary | ICD-10-CM | POA: Diagnosis not present

## 2021-06-03 DIAGNOSIS — K219 Gastro-esophageal reflux disease without esophagitis: Secondary | ICD-10-CM

## 2021-06-03 DIAGNOSIS — E663 Overweight: Secondary | ICD-10-CM

## 2021-06-03 MED ORDER — LEVOTHYROXINE SODIUM 50 MCG PO TABS: 50.0000 ug | ORAL_TABLET | Freq: Every day | ORAL | 4 refills | Status: DC

## 2021-06-03 MED ORDER — ASPIRIN-DIPYRIDAMOLE ER 25-200 MG PO CP12: 1.0000 | ORAL_CAPSULE | Freq: Two times a day (BID) | ORAL | 3 refills | Status: DC

## 2021-06-03 MED ORDER — ATORVASTATIN CALCIUM 40 MG PO TABS: 40.0000 mg | ORAL_TABLET | Freq: Every day | ORAL | 3 refills | Status: DC

## 2021-06-03 NOTE — Progress Notes (Signed)
 Subjective:    Patient ID: Brian Briggs, male    DOB: 08/25/1961, 60 y.o.   MRN: [id number]  Chief Complaint  Patient presents with   Medical Management of Chronic Issues   PT presents to the office today for chronic follow up. He has a hx of CVA in 2007. Gastroesophageal Reflux He complains of belching and heartburn. He reports no hoarse voice. This is a chronic problem. The current episode started more than 1 year ago. The problem occurs rarely. The problem has been waxing and waning. Pertinent negatives include no fatigue. He has tried a PPI for the symptoms. The treatment provided moderate relief.  Thyroid Problem Presents for follow-up visit. Patient reports no constipation, dry skin, fatigue or hoarse voice. The symptoms have been stable. His past medical history is significant for hyperlipidemia.  Hyperlipidemia This is a chronic problem. The current episode started more than 1 year ago. The problem is controlled. Exacerbating diseases include obesity. Current antihyperlipidemic treatment includes statins. The current treatment provides moderate improvement of lipids. Risk factors for coronary artery disease include a sedentary lifestyle, dyslipidemia and male sex.     Review of Systems  Constitutional:  Negative for fatigue.  HENT:  Negative for hoarse voice.   Gastrointestinal:  Positive for heartburn. Negative for constipation.  All other systems reviewed and are negative.  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[employer]  Tobacco Use   Smoking  status: Never   Smokeless tobacco: Never  Vaping Use   Vaping Use: Never used  Substance and Sexual Activity   Alcohol use: Yes    Alcohol/week: 3.0 standard drinks    Types: 3 Cans of beer per week   Drug use: No   Sexual activity: Not on file  Other Topics Concern   Not on file  Social History Narrative   Lives with wife and son   Caffeine use: daily (coffee/tea)   Right handed    Social Determinants of Health   Financial Resource Strain: Not on file  Food Insecurity: Not on file  Transportation Needs: Not on file  Physical Activity: Not on file  Stress: Not on file  Social Connections: Not on file       Objective:   Physical Exam Vitals reviewed.  Constitutional:      General: He is not in acute distress.    Appearance: He is well-developed.  HENT:     Head: Normocephalic.     Right Ear: Tympanic membrane normal.     Left Ear: Tympanic membrane normal.  Eyes:     General:        Right eye: No discharge.        Left eye: No discharge.     Pupils: Pupils are equal, round, and reactive to light.  Neck:     Thyroid: No thyromegaly.  Cardiovascular:     Rate and Rhythm: Normal rate and regular rhythm.       Heart sounds: Normal heart sounds. No murmur heard. Pulmonary:     Effort: Pulmonary effort is normal. No respiratory distress.     Breath sounds: Normal breath sounds. No wheezing.  Abdominal:     General: Bowel sounds are normal. There is no distension.     Palpations: Abdomen is soft.     Tenderness: There is no abdominal tenderness.  Musculoskeletal:        General: No tenderness. Normal range of motion.     Cervical back: Normal range of motion and neck supple.  Skin:    General: Skin is warm and dry.     Findings: No erythema or rash.  Neurological:     Mental Status: He is alert and oriented to person, place, and time.     Cranial Nerves: No cranial nerve deficit.     Deep Tendon Reflexes: Reflexes are normal and symmetric.  Psychiatric:         Behavior: Behavior normal.        Thought Content: Thought content normal.        Judgment: Judgment normal.         BP 120/75   Pulse (!) 59   Temp 97.8 F (36.6 C) (Temporal)   Ht 5' 10" (1.778 m)   Wt 184 lb 6.4 oz (83.6 kg)   SpO2 97%   BMI 26.46 kg/m   Assessment & Plan:  Brian Briggs comes in today with chief complaint of Medical Management of Chronic Issues   Diagnosis and orders addressed:  1. History of cardiovascular disorder - dipyridamole-aspirin (AGGRENOX) 200-25 MG 12hr capsule; Take 1 capsule by mouth 2 (two) times daily.  Dispense: 180 capsule; Refill: 3 - CMP14+EGFR - CBC with Differential/Platelet  2. History of CVA (cerebrovascular accident) - dipyridamole-aspirin (AGGRENOX) 200-25 MG 12hr capsule; Take 1 capsule by mouth 2 (two) times daily.  Dispense: 180 capsule; Refill: 3 - CMP14+EGFR - CBC with Differential/Platelet  3. Hyperlipidemia, unspecified hyperlipidemia type - atorvastatin (LIPITOR) 40 MG tablet; Take 1 tablet (40 mg total) by mouth daily.  Dispense: 90 tablet; Refill: 3 - CMP14+EGFR - CBC with Differential/Platelet - Lipid panel  4. Gastroesophageal reflux disease, unspecified whether esophagitis present - CMP14+EGFR - CBC with Differential/Platelet  5. Hypothyroidism, unspecified type - CMP14+EGFR - CBC with Differential/Platelet - TSH  6. Overweight (BMI 25.0-29.9) - CMP14+EGFR - CBC with Differential/Platelet  7. Annual physical exam - Varicella-zoster vaccine IM (Shingrix) - CMP14+EGFR - CBC with Differential/Platelet - Lipid panel - TSH - PSA, total and free  8. Need for shingles vaccine - Varicella-zoster vaccine IM (Shingrix) - CMP14+EGFR - CBC with Differential/Platelet   Labs pending Health Maintenance reviewed Diet and exercise encouraged  Follow up plan: 6 months    Evelina Dun, FNP

## 2021-06-03 NOTE — Progress Notes (Signed)
Shingrix

## 2021-06-03 NOTE — Patient Instructions (Signed)
Health Maintenance, Male Adopting a healthy lifestyle and getting preventive care are important in promoting health and wellness. Ask your health care provider about: The right schedule for you to have regular tests and exams. Things you can do on your own to prevent diseases and keep yourself healthy. What should I know about diet, weight, and exercise? Eat a healthy diet  Eat a diet that includes plenty of vegetables, fruits, low-fat dairy products, and lean protein. Do not eat a lot of foods that are high in solid fats, added sugars, or sodium.  Maintain a healthy weight Body mass index (BMI) is a measurement that can be used to identify possible weight problems. It estimates body fat based on height and weight. Your health care provider can help determine your BMI and help you achieve or maintain ahealthy weight. Get regular exercise Get regular exercise. This is one of the most important things you can do for your health. Most adults should: Exercise for at least 150 minutes each week. The exercise should increase your heart rate and make you sweat (moderate-intensity exercise). Do strengthening exercises at least twice a week. This is in addition to the moderate-intensity exercise. Spend less time sitting. Even light physical activity can be beneficial. Watch cholesterol and blood lipids Have your blood tested for lipids and cholesterol at 62 years of age, then havethis test every 5 years. You may need to have your cholesterol levels checked more often if: Your lipid or cholesterol levels are high. You are older than 62 years of age. You are at high risk for heart disease. What should I know about cancer screening? Many types of cancers can be detected early and may often be prevented. Depending on your health history and family history, you may need to have cancer screening at various ages. This may include screening for: Colorectal cancer. Prostate cancer. Skin cancer. Lung  cancer. What should I know about heart disease, diabetes, and high blood pressure? Blood pressure and heart disease High blood pressure causes heart disease and increases the risk of stroke. This is more likely to develop in people who have high blood pressure readings, are of African descent, or are overweight. Talk with your health care provider about your target blood pressure readings. Have your blood pressure checked: Every 3-5 years if you are 18-39 years of age. Every year if you are 40 years old or older. If you are between the ages of 65 and 75 and are a current or former smoker, ask your health care provider if you should have a one-time screening for abdominal aortic aneurysm (AAA). Diabetes Have regular diabetes screenings. This checks your fasting blood sugar level. Have the screening done: Once every three years after age 45 if you are at a normal weight and have a low risk for diabetes. More often and at a younger age if you are overweight or have a high risk for diabetes. What should I know about preventing infection? Hepatitis B If you have a higher risk for hepatitis B, you should be screened for this virus. Talk with your health care provider to find out if you are at risk forhepatitis B infection. Hepatitis C Blood testing is recommended for: Everyone born from 1945 through 1965. Anyone with known risk factors for hepatitis C. Sexually transmitted infections (STIs) You should be screened each year for STIs, including gonorrhea and chlamydia, if: You are sexually active and are younger than 62 years of age. You are older than 62 years of age   and your health care provider tells you that you are at risk for this type of infection. Your sexual activity has changed since you were last screened, and you are at increased risk for chlamydia or gonorrhea. Ask your health care provider if you are at risk. Ask your health care provider about whether you are at high risk for HIV.  Your health care provider may recommend a prescription medicine to help prevent HIV infection. If you choose to take medicine to prevent HIV, you should first get tested for HIV. You should then be tested every 3 months for as long as you are taking the medicine. Follow these instructions at home: Lifestyle Do not use any products that contain nicotine or tobacco, such as cigarettes, e-cigarettes, and chewing tobacco. If you need help quitting, ask your health care provider. Do not use street drugs. Do not share needles. Ask your health care provider for help if you need support or information about quitting drugs. Alcohol use Do not drink alcohol if your health care provider tells you not to drink. If you drink alcohol: Limit how much you have to 0-2 drinks a day. Be aware of how much alcohol is in your drink. In the [place] one drink equals one 12 oz bottle of beer (355 mL), one 5 oz glass of wine (148 mL), or one 1 oz glass of hard liquor (44 mL). General instructions Schedule regular health, dental, and eye exams. Stay current with your vaccines. Tell your health care provider if: You often feel depressed. You have ever been abused or do not feel safe at home. Summary Adopting a healthy lifestyle and getting preventive care are important in promoting health and wellness. Follow your health care provider's instructions about healthy diet, exercising, and getting tested or screened for diseases. Follow your health care provider's instructions on monitoring your cholesterol and blood pressure. This information is not intended to replace advice given to you by your health care provider. Make sure you discuss any questions you have with your healthcare provider. Document Revised: 10/24/2018 Document Reviewed: 10/24/2018 Elsevier Patient Education  2022 [employer] Inc[employer]  

## 2021-06-04 LAB — CMP14+EGFR
ALT: 8 IU/L (ref 0–44)
AST: 26 IU/L (ref 0–40)
Albumin/Globulin Ratio: 2.6 — ABNORMAL HIGH (ref 1.2–2.2)
Albumin: 4.6 g/dL (ref 3.8–4.8)
Alkaline Phosphatase: 73 IU/L (ref 44–121)
BUN/Creatinine Ratio: 17 (ref 10–24)
BUN: 17 mg/dL (ref 8–27)
Bilirubin Total: 0.7 mg/dL (ref 0.0–1.2)
CO2: 24 mmol/L (ref 20–29)
Calcium: 9.5 mg/dL (ref 8.6–10.2)
Chloride: 102 mmol/L (ref 96–106)
Creatinine, Ser: 0.99 mg/dL (ref 0.76–1.27)
Globulin, Total: 1.8 g/dL (ref 1.5–4.5)
Glucose: 96 mg/dL (ref 65–99)
Potassium: 4.5 mmol/L (ref 3.5–5.2)
Sodium: 139 mmol/L (ref 134–144)
Total Protein: 6.4 g/dL (ref 6.0–8.5)
eGFR: 86 mL/min/{1.73_m2} (ref >59)

## 2021-06-04 LAB — CBC WITH DIFFERENTIAL/PLATELET
Basophils Absolute: 0 10*3/uL (ref 0.0–0.2)
Basos: 1 %
EOS (ABSOLUTE): 0.1 10*3/uL (ref 0.0–0.4)
Eos: 1 %
Hematocrit: 40.2 % (ref 37.5–51.0)
Hemoglobin: 14.1 g/dL (ref 13.0–17.7)
Immature Grans (Abs): 0 10*3/uL (ref 0.0–0.1)
Immature Granulocytes: 0 %
Lymphocytes Absolute: 2.3 10*3/uL (ref 0.7–3.1)
Lymphs: 52 %
MCH: 31.7 pg (ref 26.6–33.0)
MCHC: 35.1 g/dL (ref 31.5–35.7)
MCV: 90 fL (ref 79–97)
Monocytes Absolute: 0.3 10*3/uL (ref 0.1–0.9)
Monocytes: 7 %
Neutrophils Absolute: 1.7 10*3/uL (ref 1.4–7.0)
Neutrophils: 39 %
Platelets: 169 10*3/uL (ref 150–450)
RBC: 4.45 x10E6/uL (ref 4.14–5.80)
RDW: 12.5 % (ref 11.6–15.4)
WBC: 4.4 10*3/uL (ref 3.4–10.8)

## 2021-06-04 LAB — LIPID PANEL
Chol/HDL Ratio: 2.7 ratio (ref 0.0–5.0)
Cholesterol, Total: 152 mg/dL (ref 100–199)
HDL: 56 mg/dL (ref >39)
LDL Chol Calc (NIH): 79 mg/dL (ref 0–99)
Triglycerides: 88 mg/dL (ref 0–149)
VLDL Cholesterol Cal: 17 mg/dL (ref 5–40)

## 2021-06-04 LAB — PSA, TOTAL AND FREE
PSA, Free Pct: 66 %
PSA, Free: 0.33 ng/mL
Prostate Specific Ag, Serum: 0.5 ng/mL (ref 0.0–4.0)

## 2021-06-04 LAB — TSH: TSH: 2.2 u[IU]/mL (ref 0.450–4.500)

## 2021-08-30 ENCOUNTER — Other Ambulatory Visit: Payer: Self-pay | Admitting: Family

## 2021-08-30 DIAGNOSIS — K219 Gastro-esophageal reflux disease without esophagitis: Secondary | ICD-10-CM

## 2021-11-29 ENCOUNTER — Other Ambulatory Visit: Payer: Self-pay | Admitting: Family

## 2021-11-29 DIAGNOSIS — K219 Gastro-esophageal reflux disease without esophagitis: Secondary | ICD-10-CM

## 2021-12-06 ENCOUNTER — Encounter: Payer: Self-pay | Admitting: Family

## 2021-12-06 ENCOUNTER — Ambulatory Visit: Payer: BC Managed Care – PPO | Admitting: Family

## 2021-12-06 VITALS — BP 133/73 | HR 46 | Temp 97.4°F | Ht 70.0 in | Wt 192.4 lb

## 2021-12-06 DIAGNOSIS — Z8673 Personal history of transient ischemic attack (TIA), and cerebral infarction without residual deficits: Secondary | ICD-10-CM

## 2021-12-06 DIAGNOSIS — E039 Hypothyroidism, unspecified: Secondary | ICD-10-CM | POA: Diagnosis not present

## 2021-12-06 DIAGNOSIS — E785 Hyperlipidemia, unspecified: Secondary | ICD-10-CM

## 2021-12-06 DIAGNOSIS — Z23 Encounter for immunization: Secondary | ICD-10-CM | POA: Diagnosis not present

## 2021-12-06 DIAGNOSIS — Z8679 Personal history of other diseases of the circulatory system: Secondary | ICD-10-CM | POA: Diagnosis not present

## 2021-12-06 DIAGNOSIS — K219 Gastro-esophageal reflux disease without esophagitis: Secondary | ICD-10-CM | POA: Diagnosis not present

## 2021-12-06 DIAGNOSIS — Z8639 Personal history of other endocrine, nutritional and metabolic disease: Secondary | ICD-10-CM

## 2021-12-06 DIAGNOSIS — E663 Overweight: Secondary | ICD-10-CM

## 2021-12-06 LAB — CMP14+EGFR
ALT: 12 IU/L (ref 0–44)
AST: 25 IU/L (ref 0–40)
Albumin/Globulin Ratio: 2 (ref 1.2–2.2)
Albumin: 4.3 g/dL (ref 3.8–4.8)
Alkaline Phosphatase: 75 IU/L (ref 44–121)
BUN/Creatinine Ratio: 12 (ref 10–24)
BUN: 13 mg/dL (ref 8–27)
Bilirubin Total: 0.4 mg/dL (ref 0.0–1.2)
CO2: 25 mmol/L (ref 20–29)
Calcium: 9.4 mg/dL (ref 8.6–10.2)
Chloride: 106 mmol/L (ref 96–106)
Creatinine, Ser: 1.05 mg/dL (ref 0.76–1.27)
Globulin, Total: 2.1 g/dL (ref 1.5–4.5)
Glucose: 98 mg/dL (ref 70–99)
Potassium: 5 mmol/L (ref 3.5–5.2)
Sodium: 143 mmol/L (ref 134–144)
Total Protein: 6.4 g/dL (ref 6.0–8.5)
eGFR: 80 mL/min/{1.73_m2} (ref >59)

## 2021-12-06 LAB — CBC WITH DIFFERENTIAL/PLATELET
Basophils Absolute: 0 10*3/uL (ref 0.0–0.2)
Basos: 1 %
EOS (ABSOLUTE): 0.1 10*3/uL (ref 0.0–0.4)
Eos: 1 %
Hematocrit: 40.9 % (ref 37.5–51.0)
Hemoglobin: 14.3 g/dL (ref 13.0–17.7)
Immature Grans (Abs): 0 10*3/uL (ref 0.0–0.1)
Immature Granulocytes: 0 %
Lymphocytes Absolute: 2.1 10*3/uL (ref 0.7–3.1)
Lymphs: 47 %
MCH: 31.8 pg (ref 26.6–33.0)
MCHC: 35 g/dL (ref 31.5–35.7)
MCV: 91 fL (ref 79–97)
Monocytes Absolute: 0.3 10*3/uL (ref 0.1–0.9)
Monocytes: 7 %
Neutrophils Absolute: 2 10*3/uL (ref 1.4–7.0)
Neutrophils: 44 %
Platelets: 166 10*3/uL (ref 150–450)
RBC: 4.49 x10E6/uL (ref 4.14–5.80)
RDW: 12.1 % (ref 11.6–15.4)
WBC: 4.5 10*3/uL (ref 3.4–10.8)

## 2021-12-06 MED ORDER — OMEPRAZOLE 40 MG PO CPDR: 40.0000 mg | DELAYED_RELEASE_CAPSULE | Freq: Every day | ORAL | 1 refills | Status: DC

## 2021-12-06 MED ORDER — FLUTICASONE PROPIONATE 50 MCG/ACT NA SUSP: 2.0000 | Freq: Every day | NASAL | 5 refills | Status: DC

## 2021-12-06 MED ORDER — ATORVASTATIN CALCIUM 40 MG PO TABS: 40.0000 mg | ORAL_TABLET | Freq: Every day | ORAL | 3 refills | Status: DC

## 2021-12-06 MED ORDER — ASPIRIN-DIPYRIDAMOLE ER 25-200 MG PO CP12: 1.0000 | ORAL_CAPSULE | Freq: Two times a day (BID) | ORAL | 3 refills | Status: DC

## 2021-12-06 MED ORDER — LEVOTHYROXINE SODIUM 50 MCG PO TABS: 50.0000 ug | ORAL_TABLET | Freq: Every day | ORAL | 4 refills | Status: DC

## 2021-12-06 NOTE — Progress Notes (Signed)
Subjective:    Patient ID: Brian Briggs, male    DOB: 1961-08-19, 61 y.o.   MRN: [id number]  Chief Complaint  Patient presents with   Medical Management of Chronic Issues   PT presents to the office today for chronic follow up. He has a hx of CVA in 2007. He takes aggrenox BID.  Gastroesophageal Reflux He complains of belching and heartburn. He reports no hoarse voice. This is a chronic problem. The current episode started more than 1 year ago. The problem occurs occasionally. The problem has been resolved. The symptoms are aggravated by certain foods. Pertinent negatives include no fatigue. He has tried a PPI for the symptoms. The treatment provided moderate relief.  Thyroid Problem Presents for follow-up visit. Patient reports no constipation, depressed mood, fatigue or hoarse voice. The symptoms have been stable. His past medical history is significant for hyperlipidemia.  Hyperlipidemia This is a chronic problem. The current episode started more than 1 year ago. The problem is controlled. Current antihyperlipidemic treatment includes statins. The current treatment provides moderate improvement of lipids. Risk factors for coronary artery disease include dyslipidemia, male sex and a sedentary lifestyle.     Review of Systems  Constitutional:  Negative for fatigue.  HENT:  Negative for hoarse voice.   Gastrointestinal:  Positive for heartburn. Negative for constipation.  All other systems reviewed and are negative.     Objective:   Physical Exam Vitals reviewed.  Constitutional:      General: He is not in acute distress.    Appearance: He is well-developed.  HENT:     Head: Normocephalic.     Right Ear: Tympanic membrane normal.     Left Ear: Tympanic membrane normal.  Eyes:     General:        Right eye: No discharge.        Left eye: No discharge.     Pupils: Pupils are equal, round, and reactive to light.  Neck:     Thyroid: No thyromegaly.  Cardiovascular:      Rate and Rhythm: Normal rate and regular rhythm.     Heart sounds: Normal heart sounds. No murmur heard. Pulmonary:     Effort: Pulmonary effort is normal. No respiratory distress.     Breath sounds: Normal breath sounds. No wheezing.  Abdominal:     General: Bowel sounds are normal. There is no distension.     Palpations: Abdomen is soft.     Tenderness: There is no abdominal tenderness.  Musculoskeletal:        General: No tenderness. Normal range of motion.     Cervical back: Normal range of motion and neck supple.  Skin:    General: Skin is warm and dry.     Findings: No erythema or rash.  Neurological:     Mental Status: He is alert and oriented to person, place, and time.     Cranial Nerves: No cranial nerve deficit.     Deep Tendon Reflexes: Reflexes are normal and symmetric.  Psychiatric:        Behavior: Behavior normal.        Thought Content: Thought content normal.        Judgment: Judgment normal.      BP 133/73    Pulse (!) 46    Temp (!) 97.4 F (36.3 C) (Temporal)    Ht 5' 10"  (1.778 m)    Wt 192 lb 6.4 oz (87.3 kg)    BMI 27.61 kg/m  Assessment & Plan:  Brian Briggs comes in today with chief complaint of Medical Management of Chronic Issues   Diagnosis and orders addressed:  1. Gastroesophageal reflux disease without esophagitis - omeprazole (PRILOSEC) 40 MG capsule; Take 1 capsule (40 mg total) by mouth daily.  Dispense: 90 capsule; Refill: 1 - CMP14+EGFR - CBC with Differential/Platelet  2. Hyperlipidemia, unspecified hyperlipidemia type - atorvastatin (LIPITOR) 40 MG tablet; Take 1 tablet (40 mg total) by mouth daily.  Dispense: 90 tablet; Refill: 3 - CMP14+EGFR - CBC with Differential/Platelet  3. History of cardiovascular disorder - dipyridamole-aspirin (AGGRENOX) 200-25 MG 12hr capsule; Take 1 capsule by mouth 2 (two) times daily.  Dispense: 180 capsule; Refill: 3 - CMP14+EGFR - CBC with Differential/Platelet  4. History of CVA  (cerebrovascular accident) - dipyridamole-aspirin (AGGRENOX) 200-25 MG 12hr capsule; Take 1 capsule by mouth 2 (two) times daily.  Dispense: 180 capsule; Refill: 3 - CMP14+EGFR - CBC with Differential/Platelet  5. Hypothyroidism, unspecified type  6. Overweight (BMI 25.0-29.9)  7. History of thyroid nodule   Labs pending Health Maintenance reviewed Diet and exercise encouraged  Follow up plan: 6 months    Evelina Dun, FNP

## 2021-12-06 NOTE — Patient Instructions (Signed)

## 2022-06-06 ENCOUNTER — Ambulatory Visit (INDEPENDENT_AMBULATORY_CARE_PROVIDER_SITE_OTHER): Payer: BC Managed Care – PPO | Admitting: Family

## 2022-06-06 ENCOUNTER — Encounter: Payer: Self-pay | Admitting: Family

## 2022-06-06 VITALS — BP 101/63 | HR 57 | Temp 97.3°F | Ht 70.0 in | Wt 182.6 lb

## 2022-06-06 DIAGNOSIS — Z0000 Encounter for general adult medical examination without abnormal findings: Secondary | ICD-10-CM

## 2022-06-06 DIAGNOSIS — Z8673 Personal history of transient ischemic attack (TIA), and cerebral infarction without residual deficits: Secondary | ICD-10-CM | POA: Diagnosis not present

## 2022-06-06 DIAGNOSIS — R001 Bradycardia, unspecified: Secondary | ICD-10-CM | POA: Diagnosis not present

## 2022-06-06 DIAGNOSIS — Z0001 Encounter for general adult medical examination with abnormal findings: Secondary | ICD-10-CM | POA: Diagnosis not present

## 2022-06-06 DIAGNOSIS — E039 Hypothyroidism, unspecified: Secondary | ICD-10-CM | POA: Diagnosis not present

## 2022-06-06 DIAGNOSIS — K219 Gastro-esophageal reflux disease without esophagitis: Secondary | ICD-10-CM | POA: Diagnosis not present

## 2022-06-06 DIAGNOSIS — E785 Hyperlipidemia, unspecified: Secondary | ICD-10-CM

## 2022-06-06 NOTE — Progress Notes (Signed)
Subjective:    Patient ID: Brian Briggs, male    DOB: [birth year], 61 y.o.   MRN: [id number]  Chief Complaint  Patient presents with   Medical Management of Chronic Issues   PT presents to the office today for chronic follow up. He has a hx of CVA in 2007. He takes aggrenox BID.  Gastroesophageal Reflux He complains of belching and heartburn. He reports no hoarse voice. This is a chronic problem. The current episode started more than 1 year ago. The problem occurs occasionally. The symptoms are aggravated by certain foods. Pertinent negatives include no fatigue. He has tried a PPI for the symptoms. The treatment provided moderate relief.  Thyroid Problem Presents for follow-up visit. Patient reports no anxiety, depressed mood, diarrhea, fatigue or hoarse voice. The symptoms have been stable. His past medical history is significant for hyperlipidemia.  Hyperlipidemia This is a chronic problem. The current episode started more than 1 year ago. The problem is controlled. Exacerbating diseases include obesity. Current antihyperlipidemic treatment includes statins. The current treatment provides moderate improvement of lipids. Risk factors for coronary artery disease include dyslipidemia, male sex, hypertension and a sedentary lifestyle.      Review of Systems  Constitutional:  Negative for fatigue.  HENT:  Negative for hoarse voice.   Gastrointestinal:  Positive for heartburn. Negative for diarrhea.  Psychiatric/Behavioral:  The patient is not nervous/anxious.   All other systems reviewed and are negative.  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[employer]  Tobacco Use   Smoking status: Never   Smokeless tobacco: Never  Vaping Use   Vaping Use: Never used  Substance and Sexual Activity   Alcohol use: Yes    Alcohol/week: 3.0 standard drinks of alcohol    Types: 3 Cans of beer per week   Drug use: No   Sexual activity: Not on file  Other Topics Concern   Not on file  Social History Narrative   Lives with wife and son   Caffeine use: daily (coffee/tea)   Right handed    Social Determinants of Health   Financial Resource Strain: Not on file  Food Insecurity: Not on file  Transportation Needs: Not on file  Physical Activity: Not on file  Stress: Not on file  Social Connections: Not on file        Objective:   Physical Exam Vitals reviewed.  Constitutional:      General: He is not in acute distress.    Appearance: He is well-developed.  HENT:     Head: Normocephalic.     Right Ear: Tympanic membrane normal.     Left Ear: Tympanic membrane normal.  Eyes:     General:        Right eye: No discharge.        Left eye: No discharge.     Pupils: Pupils are equal, round, and reactive to light.  Neck:  Thyroid: No thyromegaly.  Cardiovascular:     Rate and Rhythm: Normal rate and regular rhythm.     Heart sounds: Normal heart sounds. No murmur heard. Pulmonary:     Effort: Pulmonary effort is normal. No respiratory distress.     Breath sounds: Normal breath sounds. No wheezing.  Abdominal:     General: Bowel sounds are normal. There is no distension.     Palpations: Abdomen is soft.     Tenderness: There is no abdominal tenderness.  Musculoskeletal:        General: No tenderness. Normal range of motion.     Cervical back: Normal range of motion and neck supple.  Skin:    General: Skin is warm and dry.     Findings: No erythema or rash.  Neurological:     Mental Status: He is alert and oriented to person, place, and  time.     Cranial Nerves: No cranial nerve deficit.     Deep Tendon Reflexes: Reflexes are normal and symmetric.  Psychiatric:        Behavior: Behavior normal.        Thought Content: Thought content normal.        Judgment: Judgment normal.        BP 101/63   Pulse (!) 57   Temp (!) 97.3 F (36.3 C)   Ht 5' 10"  (1.778 m)   Wt 182 lb 9.6 oz (82.8 kg)   SpO2 99%   BMI 26.20 kg/m   Assessment & Plan:  Brian Briggs comes in today with chief complaint of Medical Management of Chronic Issues   Diagnosis and orders addressed:  1. Annual physical exam - CMP14+EGFR - CBC with Differential/Platelet - Lipid panel - PSA, total and free - TSH  2. Gastroesophageal reflux disease, unspecified whether esophagitis present - CMP14+EGFR - CBC with Differential/Platelet  3. Hypothyroidism, unspecified type - CMP14+EGFR - CBC with Differential/Platelet - TSH  4. History of CVA (cerebrovascular accident) - CMP14+EGFR - CBC with Differential/Platelet  5. Hyperlipidemia, unspecified hyperlipidemia type - CMP14+EGFR - CBC with Differential/Platelet - Lipid panel  6. Bradycardia EKG stable - EKG 12-Lead   Labs pending Health Maintenance reviewed Diet and exercise encouraged  Follow up plan: 6 months   Evelina Dun, FNP

## 2022-06-06 NOTE — Patient Instructions (Addendum)
Bradycardia, Adult Bradycardia is a slower-than-normal heartbeat. A normal resting heart rate for an adult ranges from 60 to 100 beats per minute. With bradycardia, the resting heart rate is less than 60 beats per minute. Bradycardia can prevent enough oxygen from reaching certain areas of your body when you are active. It can be serious if it keeps enough oxygen from reaching your brain and other parts of your body. Bradycardia is not a problem for everyone. For some healthy adults, a slow resting heart rate is normal. What are the causes? This condition may be caused by: A problem with the heart, including: A problem with the heart's electrical system, such as a heart block. With a heart block, electrical signals between the chambers of the heart are partially or completely blocked, so they are not able to work as they should. A problem with the heart's natural pacemaker (sinus node). Heart disease. A heart attack. Heart damage. Lyme disease. A heart infection. A heart condition that is present at birth (congenital heart defect). Certain medicines that treat heart conditions. Certain conditions, such as hypothyroidism and obstructive sleep apnea. Problems with the balance of chemicals and other substances, like potassium, in the blood. Trauma. Radiation therapy. What increases the risk? You are more likely to develop this condition if you: Are age 65 or older. Have high blood pressure (hypertension), high cholesterol (hyperlipidemia), or diabetes. Drink heavily, use tobacco or nicotine products, or use drugs. What are the signs or symptoms? Symptoms of this condition include: Light-headedness. Feeling faint or fainting. Fatigue and weakness. Trouble with activity or exercise. Shortness of breath. Chest pain (angina). Drowsiness. Confusion. Dizziness. How is this diagnosed? This condition may be diagnosed based on: Your symptoms. Your medical history. A physical exam. During  the exam, your health care provider will listen to your heartbeat and check your pulse. To confirm the diagnosis, your health care provider may order tests, such as: Blood tests. An electrocardiogram (ECG). This test records the heart's electrical activity. The test can show how fast your heart is beating and whether the heartbeat is steady. A test in which you wear a portable device (event recorder or Holter monitor) to record your heart's electrical activity while you go about your day. An exercise test. How is this treated? Treatment for this condition depends on the cause of the condition and how severe your symptoms are. Treatment may involve: Treatment of the underlying condition. Changing your medicines or how much medicine you take. Having a small, battery-operated device called a pacemaker implanted under the skin. When bradycardia occurs, this device can be used to increase your heart rate and help your heart beat in a regular rhythm. Follow these instructions at home: Lifestyle Manage any health conditions that contribute to bradycardia as told by your health care provider. Follow a heart-healthy diet. A nutrition specialist (dietitian) can help educate you about healthy food options and changes. Follow an exercise program that is approved by your health care provider. Maintain a healthy weight. Try to reduce or manage your stress, such as with yoga or meditation. If you need help reducing stress, ask your health care provider. Do not use any products that contain nicotine or tobacco. These products include cigarettes, chewing tobacco, and vaping devices, such as e-cigarettes. If you need help quitting, ask your health care provider. Do not use illegal drugs. Alcohol use If you drink alcohol: Limit how much you have to: 0-1 drink a day for women who are not pregnant. 0-2 drinks a day   for men. Know how much alcohol is in a drink. In the [place] one drink equals one 12 oz bottle of  beer (355 mL), one 5 oz glass of wine (148 mL), or one 1 oz glass of hard liquor (44 mL). General instructions Take over-the-counter and prescription medicines only as told by your health care provider. Keep all follow-up visits. This is important. How is this prevented? In some cases, bradycardia may be prevented by: Treating underlying medical problems. Stopping behaviors or medicines that can trigger the condition. Contact a health care provider if: You feel light-headed or dizzy. You almost faint. You feel weak or are easily fatigued during physical activity. You experience confusion or have memory problems. Get help right away if: You faint. You have chest pains or an irregular heartbeat (palpitations). You have trouble breathing. These symptoms may represent a serious problem that is an emergency. Do not wait to see if the symptoms will go away. Get medical help right away. Call your local emergency services (911 in the [place] Do not drive yourself to the hospital. Summary Bradycardia is a slower-than-normal heartbeat. With bradycardia, the resting heart rate is less than 60 beats per minute. Treatment for this condition depends on the cause. Manage any health conditions that contribute to bradycardia as told by your health care provider. Do not use any products that contain nicotine or tobacco. These products include cigarettes, chewing tobacco, and vaping devices, such as e-cigarettes. Keep all follow-up visits. This is important. This information is not intended to replace advice given to you by your health care provider. Make sure you discuss any questions you have with your health care provider. Document Revised: 02/21/2021 Document Reviewed: 02/21/2021 Elsevier Patient Education  2023 [employer] Inc[employer]  

## 2022-06-07 LAB — CMP14+EGFR
ALT: 10 IU/L (ref 0–44)
AST: 27 IU/L (ref 0–40)
Albumin/Globulin Ratio: 2.2 (ref 1.2–2.2)
Albumin: 4.4 g/dL (ref 3.9–4.9)
Alkaline Phosphatase: 74 IU/L (ref 44–121)
BUN/Creatinine Ratio: 16 (ref 10–24)
BUN: 17 mg/dL (ref 8–27)
Bilirubin Total: 0.8 mg/dL (ref 0.0–1.2)
CO2: 26 mmol/L (ref 20–29)
Calcium: 9.4 mg/dL (ref 8.6–10.2)
Chloride: 103 mmol/L (ref 96–106)
Creatinine, Ser: 1.05 mg/dL (ref 0.76–1.27)
Globulin, Total: 2 g/dL (ref 1.5–4.5)
Glucose: 90 mg/dL (ref 70–99)
Potassium: 4.8 mmol/L (ref 3.5–5.2)
Sodium: 139 mmol/L (ref 134–144)
Total Protein: 6.4 g/dL (ref 6.0–8.5)
eGFR: 80 mL/min/{1.73_m2} (ref >59)

## 2022-06-07 LAB — LIPID PANEL
Chol/HDL Ratio: 2.5 ratio (ref 0.0–5.0)
Cholesterol, Total: 136 mg/dL (ref 100–199)
HDL: 54 mg/dL (ref >39)
LDL Chol Calc (NIH): 55 mg/dL (ref 0–99)
Triglycerides: 162 mg/dL — ABNORMAL HIGH (ref 0–149)
VLDL Cholesterol Cal: 27 mg/dL (ref 5–40)

## 2022-06-07 LAB — CBC WITH DIFFERENTIAL/PLATELET
Basophils Absolute: 0 10*3/uL (ref 0.0–0.2)
Basos: 1 %
EOS (ABSOLUTE): 0.1 10*3/uL (ref 0.0–0.4)
Eos: 1 %
Hematocrit: 40.4 % (ref 37.5–51.0)
Hemoglobin: 14.3 g/dL (ref 13.0–17.7)
Immature Grans (Abs): 0 10*3/uL (ref 0.0–0.1)
Immature Granulocytes: 0 %
Lymphocytes Absolute: 1.7 10*3/uL (ref 0.7–3.1)
Lymphs: 41 %
MCH: 31.9 pg (ref 26.6–33.0)
MCHC: 35.4 g/dL (ref 31.5–35.7)
MCV: 90 fL (ref 79–97)
Monocytes Absolute: 0.3 10*3/uL (ref 0.1–0.9)
Monocytes: 7 %
Neutrophils Absolute: 2.1 10*3/uL (ref 1.4–7.0)
Neutrophils: 50 %
Platelets: 151 10*3/uL (ref 150–450)
RBC: 4.48 x10E6/uL (ref 4.14–5.80)
RDW: 12.3 % (ref 11.6–15.4)
WBC: 4.1 10*3/uL (ref 3.4–10.8)

## 2022-06-07 LAB — PSA, TOTAL AND FREE
PSA, Free Pct: 66 %
PSA, Free: 0.33 ng/mL
Prostate Specific Ag, Serum: 0.5 ng/mL (ref 0.0–4.0)

## 2022-06-07 LAB — TSH: TSH: 2.87 u[IU]/mL (ref 0.450–4.500)

## 2022-09-05 ENCOUNTER — Other Ambulatory Visit: Payer: Self-pay | Admitting: Family

## 2022-09-05 DIAGNOSIS — K219 Gastro-esophageal reflux disease without esophagitis: Secondary | ICD-10-CM

## 2022-12-01 ENCOUNTER — Other Ambulatory Visit: Payer: Self-pay | Admitting: Family

## 2022-12-01 DIAGNOSIS — K219 Gastro-esophageal reflux disease without esophagitis: Secondary | ICD-10-CM

## 2022-12-08 ENCOUNTER — Encounter: Payer: Self-pay | Admitting: Family

## 2022-12-08 ENCOUNTER — Ambulatory Visit: Payer: BC Managed Care – PPO | Admitting: Family

## 2022-12-08 VITALS — BP 113/74 | HR 60 | Temp 97.1°F | Ht 70.0 in | Wt 190.6 lb

## 2022-12-08 DIAGNOSIS — E785 Hyperlipidemia, unspecified: Secondary | ICD-10-CM | POA: Diagnosis not present

## 2022-12-08 DIAGNOSIS — Z8673 Personal history of transient ischemic attack (TIA), and cerebral infarction without residual deficits: Secondary | ICD-10-CM

## 2022-12-08 DIAGNOSIS — E039 Hypothyroidism, unspecified: Secondary | ICD-10-CM

## 2022-12-08 DIAGNOSIS — K219 Gastro-esophageal reflux disease without esophagitis: Secondary | ICD-10-CM

## 2022-12-08 DIAGNOSIS — E663 Overweight: Secondary | ICD-10-CM

## 2022-12-08 DIAGNOSIS — Z8679 Personal history of other diseases of the circulatory system: Secondary | ICD-10-CM

## 2022-12-08 MED ORDER — ASPIRIN-DIPYRIDAMOLE ER 25-200 MG PO CP12: 1.0000 | ORAL_CAPSULE | Freq: Two times a day (BID) | ORAL | 3 refills | Status: DC

## 2022-12-08 MED ORDER — ATORVASTATIN CALCIUM 40 MG PO TABS: 40.0000 mg | ORAL_TABLET | Freq: Every day | ORAL | 3 refills | Status: DC

## 2022-12-08 MED ORDER — LEVOTHYROXINE SODIUM 50 MCG PO TABS: 50.0000 ug | ORAL_TABLET | Freq: Every day | ORAL | 4 refills | Status: DC

## 2022-12-08 MED ORDER — OMEPRAZOLE 40 MG PO CPDR: 40.0000 mg | DELAYED_RELEASE_CAPSULE | Freq: Every day | ORAL | 0 refills | Status: DC

## 2022-12-08 NOTE — Progress Notes (Signed)
Subjective:    Patient ID: Brian Briggs, male    DOB: 21-Jun-1961, 63 y.o.   MRN: [id number]  Chief Complaint  Patient presents with   Medical Management of Chronic Issues   PT presents to the office today for chronic follow up. He has a hx of CVA in 2007. He takes aggrenox BID.   Gastroesophageal Reflux He complains of belching and heartburn. He reports no hoarse voice. This is a chronic problem. The current episode started more than 1 year ago. The problem occurs occasionally. Pertinent negatives include no fatigue. He has tried a PPI for the symptoms. The treatment provided moderate relief.  Thyroid Problem Presents for follow-up visit. Patient reports no constipation, dry skin, fatigue or hoarse voice. The symptoms have been stable. His past medical history is significant for hyperlipidemia.  Hyperlipidemia This is a chronic problem. The current episode started more than 1 year ago. The problem is controlled. Current antihyperlipidemic treatment includes statins. The current treatment provides moderate improvement of lipids. Risk factors for coronary artery disease include dyslipidemia, hypertension and a sedentary lifestyle.      Review of Systems  Constitutional:  Negative for fatigue.  HENT:  Negative for hoarse voice.   Gastrointestinal:  Positive for heartburn. Negative for constipation.  All other systems reviewed and are negative.      Objective:   Physical Exam Vitals reviewed.  Constitutional:      General: He is not in acute distress.    Appearance: He is well-developed. He is obese.  HENT:     Head: Normocephalic.     Right Ear: Tympanic membrane normal.     Left Ear: Tympanic membrane normal.  Eyes:     General:        Right eye: No discharge.        Left eye: No discharge.     Pupils: Pupils are equal, round, and reactive to light.  Neck:     Thyroid: No thyromegaly.  Cardiovascular:     Rate and Rhythm: Normal rate and regular rhythm.     Heart  sounds: Normal heart sounds. No murmur heard. Pulmonary:     Effort: Pulmonary effort is normal. No respiratory distress.     Breath sounds: Normal breath sounds. No wheezing.  Abdominal:     General: Bowel sounds are normal. There is no distension.     Palpations: Abdomen is soft.     Tenderness: There is no abdominal tenderness.  Musculoskeletal:        General: No tenderness. Normal range of motion.     Cervical back: Normal range of motion and neck supple.  Skin:    General: Skin is warm and dry.     Findings: No erythema or rash.  Neurological:     Mental Status: He is alert and oriented to person, place, and time.     Cranial Nerves: No cranial nerve deficit.     Deep Tendon Reflexes: Reflexes are normal and symmetric.  Psychiatric:        Behavior: Behavior normal.        Thought Content: Thought content normal.        Judgment: Judgment normal.       BP 113/74   Pulse 60   Temp (!) 97.1 F (36.2 C) (Temporal)   Ht 5\' 10"  (1.778 m)   Wt 190 lb 9.6 oz (86.5 kg)   SpO2 100%   BMI 27.35 kg/m      Assessment & Plan:  Brian Briggs comes in today with chief complaint of Medical Management of Chronic Issues   Diagnosis and orders addressed:  1. Hyperlipidemia, unspecified hyperlipidemia type - atorvastatin (LIPITOR) 40 MG tablet; Take 1 tablet (40 mg total) by mouth daily.  Dispense: 90 tablet; Refill: 3 - CMP14+EGFR  2. History of cardiovascular disorder - dipyridamole-aspirin (AGGRENOX) 200-25 MG 12hr capsule; Take 1 capsule by mouth 2 (two) times daily.  Dispense: 180 capsule; Refill: 3 - CMP14+EGFR  3. History of CVA (cerebrovascular accident) - dipyridamole-aspirin (AGGRENOX) 200-25 MG 12hr capsule; Take 1 capsule by mouth 2 (two) times daily.  Dispense: 180 capsule; Refill: 3 - CMP14+EGFR  4. Gastroesophageal reflux disease without esophagitis - omeprazole (PRILOSEC) 40 MG capsule; Take 1 capsule (40 mg total) by mouth daily.  Dispense: 90 capsule;  Refill: 0 - CMP14+EGFR  5. Hypothyroidism, unspecified type - CMP14+EGFR - TSH  6. Overweight (BMI 25.0-29.9)  - CMP14+EGFR   Labs pending Health Maintenance reviewed Diet and exercise encouraged  Follow up plan: 6 months   Evelina Dun, FNP

## 2022-12-08 NOTE — Patient Instructions (Signed)
Eustachian Tube Dysfunction  Eustachian tube dysfunction refers to a condition in which a blockage develops in the narrow passage that connects the middle ear to the back of the nose (eustachian tube). The eustachian tube regulates air pressure in the middle ear by letting air move between the ear and nose. It also helps to drain fluid from the middle ear space. Eustachian tube dysfunction can affect one or both ears. When the eustachian tube does not function properly, air pressure, fluid, or both can build up in the middle ear. What are the causes? This condition occurs when the eustachian tube becomes blocked or cannot open normally. Common causes of this condition include: Ear infections. Colds and other infections that affect the nose, mouth, and throat (upper respiratory tract). Allergies. Irritation from cigarette smoke. Irritation from stomach acid coming up into the esophagus (gastroesophageal reflux). The esophagus is the part of the body that moves food from the mouth to the stomach. Sudden changes in air pressure, such as from descending in an airplane or scuba diving. Abnormal growths in the nose or throat, such as: Growths that line the nose (nasal polyps). Abnormal growth of cells (tumors). Enlarged tissue at the back of the throat (adenoids). What increases the risk? You are more likely to develop this condition if: You smoke. You are overweight. You are a child who has: Certain birth defects of the mouth, such as cleft palate. Large tonsils or adenoids. What are the signs or symptoms? Common symptoms of this condition include: A feeling of fullness in the ear. Ear pain. Clicking or popping noises in the ear. Ringing in the ear (tinnitus). Hearing loss. Loss of balance. Dizziness. Symptoms may get worse when the air pressure around you changes, such as when you travel to an area of high elevation, fly on an airplane, or go scuba diving. How is this diagnosed? This  condition may be diagnosed based on: Your symptoms. A physical exam of your ears, nose, and throat. Tests, such as those that measure: The movement of your eardrum. Your hearing (audiometry). How is this treated? Treatment depends on the cause and severity of your condition. In mild cases, you may relieve your symptoms by moving air into your ears. This is called "popping the ears." In more severe cases, or if you have symptoms of fluid in your ears, treatment may include: Medicines to relieve congestion (decongestants). Medicines that treat allergies (antihistamines). Nasal sprays or ear drops that contain medicines that reduce swelling (steroids). A procedure to drain the fluid in your eardrum. In this procedure, a small tube may be placed in the eardrum to: Drain the fluid. Restore the air in the middle ear space. A procedure to insert a balloon device through the nose to inflate the opening of the eustachian tube (balloon dilation). Follow these instructions at home: Lifestyle Do not do any of the following until your health care provider approves: Travel to high altitudes. Fly in airplanes. Work in a pressurized cabin or room. Scuba dive. Do not use any products that contain nicotine or tobacco. These products include cigarettes, chewing tobacco, and vaping devices, such as e-cigarettes. If you need help quitting, ask your health care provider. Keep your ears dry. Wear fitted earplugs during showering and bathing. Dry your ears completely after. General instructions Take over-the-counter and prescription medicines only as told by your health care provider. Use techniques to help pop your ears as recommended by your health care provider. These may include: Chewing gum. Yawning. Frequent, forceful swallowing.   Closing your mouth, holding your nose closed, and gently blowing as if you are trying to blow air out of your nose. Keep all follow-up visits. This is important. Contact a  health care provider if: Your symptoms do not go away after treatment. Your symptoms come back after treatment. You are unable to pop your ears. You have: A fever. Pain in your ear. Pain in your head or neck. Fluid draining from your ear. Your hearing suddenly changes. You become very dizzy. You lose your balance. Get help right away if: You have a sudden, severe increase in any of your symptoms. Summary Eustachian tube dysfunction refers to a condition in which a blockage develops in the eustachian tube. It can be caused by ear infections, allergies, inhaled irritants, or abnormal growths in the nose or throat. Symptoms may include ear pain or fullness, hearing loss, or ringing in the ears. Mild cases are treated with techniques to unblock the ears, such as yawning or chewing gum. More severe cases are treated with medicines or procedures. This information is not intended to replace advice given to you by your health care provider. Make sure you discuss any questions you have with your health care provider. Document Revised: 01/11/2021 Document Reviewed: 01/11/2021 Elsevier Patient Education  2023 [employer] Inc.  

## 2022-12-09 LAB — CMP14+EGFR
ALT: 12 IU/L (ref 0–44)
AST: 29 IU/L (ref 0–40)
Albumin/Globulin Ratio: 2.3 — ABNORMAL HIGH (ref 1.2–2.2)
Albumin: 4.6 g/dL (ref 3.9–4.9)
Alkaline Phosphatase: 84 IU/L (ref 44–121)
BUN/Creatinine Ratio: 13 (ref 10–24)
BUN: 13 mg/dL (ref 8–27)
Bilirubin Total: 0.6 mg/dL (ref 0.0–1.2)
CO2: 22 mmol/L (ref 20–29)
Calcium: 9.6 mg/dL (ref 8.6–10.2)
Chloride: 104 mmol/L (ref 96–106)
Creatinine, Ser: 1.01 mg/dL (ref 0.76–1.27)
Globulin, Total: 2 g/dL (ref 1.5–4.5)
Glucose: 95 mg/dL (ref 70–99)
Potassium: 4.8 mmol/L (ref 3.5–5.2)
Sodium: 143 mmol/L (ref 134–144)
Total Protein: 6.6 g/dL (ref 6.0–8.5)
eGFR: 84 mL/min/{1.73_m2} (ref >59)

## 2022-12-09 LAB — TSH: TSH: 3.09 u[IU]/mL (ref 0.450–4.500)

## 2023-02-27 ENCOUNTER — Other Ambulatory Visit: Payer: Self-pay | Admitting: Family

## 2023-04-24 ENCOUNTER — Encounter: Payer: Self-pay | Admitting: *Deleted

## 2023-05-31 ENCOUNTER — Other Ambulatory Visit: Payer: Self-pay | Admitting: Family

## 2023-05-31 DIAGNOSIS — K219 Gastro-esophageal reflux disease without esophagitis: Secondary | ICD-10-CM

## 2023-06-08 ENCOUNTER — Ambulatory Visit: Payer: BC Managed Care – PPO | Admitting: Family

## 2023-06-16 ENCOUNTER — Ambulatory Visit: Payer: BC Managed Care – PPO | Admitting: Family

## 2023-06-16 ENCOUNTER — Encounter: Payer: Self-pay | Admitting: Family

## 2023-06-16 VITALS — BP 133/80 | HR 57 | Temp 97.6°F | Ht 70.0 in | Wt 188.6 lb

## 2023-06-16 DIAGNOSIS — E039 Hypothyroidism, unspecified: Secondary | ICD-10-CM | POA: Diagnosis not present

## 2023-06-16 DIAGNOSIS — Z8673 Personal history of transient ischemic attack (TIA), and cerebral infarction without residual deficits: Secondary | ICD-10-CM

## 2023-06-16 DIAGNOSIS — Z0000 Encounter for general adult medical examination without abnormal findings: Secondary | ICD-10-CM

## 2023-06-16 DIAGNOSIS — E785 Hyperlipidemia, unspecified: Secondary | ICD-10-CM

## 2023-06-16 DIAGNOSIS — Z8679 Personal history of other diseases of the circulatory system: Secondary | ICD-10-CM

## 2023-06-16 DIAGNOSIS — E663 Overweight: Secondary | ICD-10-CM

## 2023-06-16 DIAGNOSIS — Z0001 Encounter for general adult medical examination with abnormal findings: Secondary | ICD-10-CM

## 2023-06-16 DIAGNOSIS — K219 Gastro-esophageal reflux disease without esophagitis: Secondary | ICD-10-CM | POA: Diagnosis not present

## 2023-06-16 LAB — LIPID PANEL
Chol/HDL Ratio: 2.9 ratio (ref 0.0–5.0)
Cholesterol, Total: 136 mg/dL (ref 100–199)
HDL: 47 mg/dL (ref >39)
LDL Chol Calc (NIH): 69 mg/dL (ref 0–99)
Triglycerides: 112 mg/dL (ref 0–149)
VLDL Cholesterol Cal: 20 mg/dL (ref 5–40)

## 2023-06-16 LAB — PSA, TOTAL AND FREE

## 2023-06-16 LAB — CBC WITH DIFFERENTIAL/PLATELET
Basophils Absolute: 0 10*3/uL (ref 0.0–0.2)
Basos: 1 %
EOS (ABSOLUTE): 0.1 10*3/uL (ref 0.0–0.4)
Eos: 1 %
Hematocrit: 41.9 % (ref 37.5–51.0)
Hemoglobin: 14.5 g/dL (ref 13.0–17.7)
Immature Grans (Abs): 0 10*3/uL (ref 0.0–0.1)
Immature Granulocytes: 0 %
Lymphocytes Absolute: 1.8 10*3/uL (ref 0.7–3.1)
Lymphs: 47 %
MCH: 31.5 pg (ref 26.6–33.0)
MCHC: 34.6 g/dL (ref 31.5–35.7)
MCV: 91 fL (ref 79–97)
Monocytes Absolute: 0.3 10*3/uL (ref 0.1–0.9)
Monocytes: 8 %
Neutrophils Absolute: 1.6 10*3/uL (ref 1.4–7.0)
Neutrophils: 43 %
Platelets: 162 10*3/uL (ref 150–450)
RBC: 4.6 x10E6/uL (ref 4.14–5.80)
RDW: 12.8 % (ref 11.6–15.4)
WBC: 3.8 10*3/uL (ref 3.4–10.8)

## 2023-06-16 LAB — CMP14+EGFR
ALT: 13 IU/L (ref 0–44)
AST: 27 IU/L (ref 0–40)
Albumin: 4.5 g/dL (ref 3.9–4.9)
Alkaline Phosphatase: 82 IU/L (ref 44–121)
BUN/Creatinine Ratio: 15 (ref 10–24)
BUN: 16 mg/dL (ref 8–27)
Bilirubin Total: 0.8 mg/dL (ref 0.0–1.2)
CO2: 23 mmol/L (ref 20–29)
Calcium: 9.5 mg/dL (ref 8.6–10.2)
Chloride: 104 mmol/L (ref 96–106)
Creatinine, Ser: 1.06 mg/dL (ref 0.76–1.27)
Globulin, Total: 2.1 g/dL (ref 1.5–4.5)
Glucose: 99 mg/dL (ref 70–99)
Potassium: 4.3 mmol/L (ref 3.5–5.2)
Sodium: 140 mmol/L (ref 134–144)
Total Protein: 6.6 g/dL (ref 6.0–8.5)
eGFR: 78 mL/min/{1.73_m2} (ref >59)

## 2023-06-16 LAB — TSH

## 2023-06-16 MED ORDER — OMEPRAZOLE 40 MG PO CPDR
40.0000 mg | DELAYED_RELEASE_CAPSULE | Freq: Every day | ORAL | 0 refills | Status: DC
Start: 2023-06-16 — End: 2023-12-04

## 2023-06-16 MED ORDER — FLUTICASONE PROPIONATE 50 MCG/ACT NA SUSP
2.0000 | Freq: Every day | NASAL | 6 refills | Status: DC
Start: 2023-06-16 — End: 2024-07-09

## 2023-06-16 MED ORDER — FLUTICASONE PROPIONATE 50 MCG/ACT NA SUSP: 2.0000 | Freq: Every day | NASAL | 2 refills | Status: DC

## 2023-06-16 MED ORDER — ATORVASTATIN CALCIUM 40 MG PO TABS
40.0000 mg | ORAL_TABLET | Freq: Every day | ORAL | 3 refills | Status: DC
Start: 2023-06-16 — End: 2024-06-20

## 2023-06-16 MED ORDER — LEVOTHYROXINE SODIUM 50 MCG PO TABS: 50.0000 ug | ORAL_TABLET | Freq: Every day | ORAL | 4 refills | Status: DC

## 2023-06-16 NOTE — Patient Instructions (Signed)
Health Maintenance, Male Adopting a healthy lifestyle and getting preventive care are important in promoting health and wellness. Ask your health care provider about: The right schedule for you to have regular tests and exams. Things you can do on your own to prevent diseases and keep yourself healthy. What should I know about diet, weight, and exercise? Eat a healthy diet  Eat a diet that includes plenty of vegetables, fruits, low-fat dairy products, and lean protein. Do not eat a lot of foods that are high in solid fats, added sugars, or sodium. Maintain a healthy weight Body mass index (BMI) is a measurement that can be used to identify possible weight problems. It estimates body fat based on height and weight. Your health care provider can help determine your BMI and help you achieve or maintain a healthy weight. Get regular exercise Get regular exercise. This is one of the most important things you can do for your health. Most adults should: Exercise for at least 150 minutes each week. The exercise should increase your heart rate and make you sweat (moderate-intensity exercise). Do strengthening exercises at least twice a week. This is in addition to the moderate-intensity exercise. Spend less time sitting. Even light physical activity can be beneficial. Watch cholesterol and blood lipids Have your blood tested for lipids and cholesterol at 64 years of age, then have this test every 5 years. You may need to have your cholesterol levels checked more often if: Your lipid or cholesterol levels are high. You are older than 64 years of age. You are at high risk for heart disease. What should I know about cancer screening? Many types of cancers can be detected early and may often be prevented. Depending on your health history and family history, you may need to have cancer screening at various ages. This may include screening for: Colorectal cancer. Prostate cancer. Skin cancer. Lung  cancer. What should I know about heart disease, diabetes, and high blood pressure? Blood pressure and heart disease High blood pressure causes heart disease and increases the risk of stroke. This is more likely to develop in people who have high blood pressure readings or are overweight. Talk with your health care provider about your target blood pressure readings. Have your blood pressure checked: Every 3-5 years if you are 18-39 years of age. Every year if you are 40 years old or older. If you are between the ages of 65 and 75 and are a current or former smoker, ask your health care provider if you should have a one-time screening for abdominal aortic aneurysm (AAA). Diabetes Have regular diabetes screenings. This checks your fasting blood sugar level. Have the screening done: Once every three years after age 45 if you are at a normal weight and have a low risk for diabetes. More often and at a younger age if you are overweight or have a high risk for diabetes. What should I know about preventing infection? Hepatitis B If you have a higher risk for hepatitis B, you should be screened for this virus. Talk with your health care provider to find out if you are at risk for hepatitis B infection. Hepatitis C Blood testing is recommended for: Everyone born from 1945 through 1965. Anyone with known risk factors for hepatitis C. Sexually transmitted infections (STIs) You should be screened each year for STIs, including gonorrhea and chlamydia, if: You are sexually active and are younger than 64 years of age. You are older than 64 years of age and your   health care provider tells you that you are at risk for this type of infection. Your sexual activity has changed since you were last screened, and you are at increased risk for chlamydia or gonorrhea. Ask your health care provider if you are at risk. Ask your health care provider about whether you are at high risk for HIV. Your health care provider  may recommend a prescription medicine to help prevent HIV infection. If you choose to take medicine to prevent HIV, you should first get tested for HIV. You should then be tested every 3 months for as long as you are taking the medicine. Follow these instructions at home: Alcohol use Do not drink alcohol if your health care provider tells you not to drink. If you drink alcohol: Limit how much you have to 0-2 drinks a day. Know how much alcohol is in your drink. In the [place] one drink equals one 12 oz bottle of beer (355 mL), one 5 oz glass of wine (148 mL), or one 1 oz glass of hard liquor (44 mL). Lifestyle Do not use any products that contain nicotine or tobacco. These products include cigarettes, chewing tobacco, and vaping devices, such as e-cigarettes. If you need help quitting, ask your health care provider. Do not use street drugs. Do not share needles. Ask your health care provider for help if you need support or information about quitting drugs. General instructions Schedule regular health, dental, and eye exams. Stay current with your vaccines. Tell your health care provider if: You often feel depressed. You have ever been abused or do not feel safe at home. Summary Adopting a healthy lifestyle and getting preventive care are important in promoting health and wellness. Follow your health care provider's instructions about healthy diet, exercising, and getting tested or screened for diseases. Follow your health care provider's instructions on monitoring your cholesterol and blood pressure. This information is not intended to replace advice given to you by your health care provider. Make sure you discuss any questions you have with your health care provider. Document Revised: 03/22/2021 Document Reviewed: 03/22/2021 Elsevier Patient Education  2024 [employer]  

## 2023-06-16 NOTE — Progress Notes (Signed)
Subjective:    Patient ID: Brian Briggs, male    DOB: [birth year], 62 y.o.   MRN: [id number]  Chief Complaint  Patient presents with   Medical Management of Chronic Issues    Fasting no concerns    PT presents to the office today for CPE and chronic follow up. He has a hx of CVA in 2007. He takes aggrenox BID.   Gastroesophageal Reflux He complains of belching and heartburn. He reports no hoarse voice. This is a chronic problem. The current episode started more than 1 year ago. The problem occurs occasionally. Pertinent negatives include no fatigue. He has tried a PPI for the symptoms. The treatment provided moderate relief.  Thyroid Problem Presents for follow-up visit. Patient reports no constipation, diarrhea, fatigue or hoarse voice. The symptoms have been stable. His past medical history is significant for hyperlipidemia.  Hyperlipidemia This is a chronic problem. The current episode started more than 1 year ago. The problem is controlled. Recent lipid tests were reviewed and are normal. Current antihyperlipidemic treatment includes statins. The current treatment provides moderate improvement of lipids. Risk factors for coronary artery disease include hypertension and a sedentary lifestyle.      Review of Systems  Constitutional:  Negative for fatigue.  HENT:  Negative for hoarse voice.   Gastrointestinal:  Positive for heartburn. Negative for constipation and diarrhea.  All other systems reviewed and are negative.  Family History  Problem Relation Age of Onset   Hyperlipidemia Mother    Prostate cancer Father    Hyperlipidemia Brother    Colon polyps Neg Hx    Colon cancer Neg Hx    Esophageal cancer Neg Hx    Rectal cancer Neg Hx    Stomach cancer Neg Hx    Social History   Socioeconomic History   Marital status: Married    Spouse name: Not on file   Number of children: 1   Years of education: 14   Highest education level: Not on file  Occupational History    Occupation: CDL  Tobacco Use   Smoking status: Never   Smokeless tobacco: Never  Vaping Use   Vaping status: Never Used  Substance and Sexual Activity   Alcohol use: Yes    Alcohol/week: 3.0 standard drinks of alcohol    Types: 3 Cans of beer per week   Drug use: No   Sexual activity: Not on file  Other Topics Concern   Not on file  Social History Narrative   Lives with wife and son   Caffeine use: daily (coffee/tea)   Right handed    Social Determinants of Health   Financial Resource Strain: Not on file  Food Insecurity: Not on file  Transportation Needs: Not on file  Physical Activity: Not on file  Stress: Not on file  Social Connections: Unknown (05/25/2022)   Received from [facility], [facility]   Social Network    Social Network: Not on file        Objective:   Physical Exam Vitals reviewed.  Constitutional:      General: He is not in acute distress.    Appearance: He is well-developed.  HENT:     Head: Normocephalic.     Right Ear: Tympanic membrane normal.     Left Ear: Tympanic membrane normal.  Eyes:     General:        Right eye: No discharge.        Left eye: No discharge.  Pupils: Pupils are equal, round, and reactive to light.  Neck:     Thyroid: No thyromegaly.  Cardiovascular:     Rate and Rhythm: Normal rate and regular rhythm.     Heart sounds: Normal heart sounds. No murmur heard. Pulmonary:     Effort: Pulmonary effort is normal. No respiratory distress.     Breath sounds: Normal breath sounds. No wheezing.  Abdominal:     General: Bowel sounds are normal. There is no distension.     Palpations: Abdomen is soft.     Tenderness: There is no abdominal tenderness.  Musculoskeletal:        General: No tenderness. Normal range of motion.     Cervical back: Normal range of motion and neck supple.  Skin:    General: Skin is warm and dry.     Findings: No erythema or rash.  Neurological:     Mental Status: He is alert and oriented  to person, place, and time.     Cranial Nerves: No cranial nerve deficit.     Deep Tendon Reflexes: Reflexes are normal and symmetric.  Psychiatric:        Behavior: Behavior normal.        Thought Content: Thought content normal.        Judgment: Judgment normal.       BP 133/80   Pulse (!) 57   Temp 97.6 F (36.4 C) (Temporal)   Ht 5\' 10"  (1.778 m)   Wt 188 lb 9.6 oz (85.5 kg)   SpO2 96%   BMI 27.06 kg/m      Assessment & Plan:  Brian Briggs comes in today with chief complaint of Medical Management of Chronic Issues (Fasting no concerns )   Diagnosis and orders addressed:  1. Hyperlipidemia, unspecified hyperlipidemia type - CBC with Differential/Platelet - CMP14+EGFR - atorvastatin (LIPITOR) 40 MG tablet; Take 1 tablet (40 mg total) by mouth daily.  Dispense: 90 tablet; Refill: 3  2. Gastroesophageal reflux disease without esophagitis - CBC with Differential/Platelet - CMP14+EGFR - omeprazole (PRILOSEC) 40 MG capsule; Take 1 capsule (40 mg total) by mouth daily.  Dispense: 90 capsule; Refill: 0  3. Annual physical exam - CBC with Differential/Platelet - CMP14+EGFR - Lipid panel - TSH - PSA, total and free  4. Hypothyroidism, unspecified type - CBC with Differential/Platelet - CMP14+EGFR - levothyroxine (SYNTHROID) 50 MCG tablet; Take 1 tablet (50 mcg total) by mouth daily.  Dispense: 90 tablet; Refill: 4  5. Overweight (BMI 25.0-29.9) - CBC with Differential/Platelet - CMP14+EGFR  6. History of CVA (cerebrovascular accident) - CBC with Differential/Platelet - CMP14+EGFR  7. History of cardiovascular disorder - CBC with Differential/Platelet - CMP14+EGFR   Labs pending Health Maintenance reviewed Diet and exercise encouraged  Follow up plan: 6 months    Jannifer Rodney, FNP

## 2023-12-04 ENCOUNTER — Other Ambulatory Visit: Payer: Self-pay | Admitting: Family

## 2023-12-04 DIAGNOSIS — K219 Gastro-esophageal reflux disease without esophagitis: Secondary | ICD-10-CM

## 2023-12-18 ENCOUNTER — Ambulatory Visit: Payer: Medicare PPO | Admitting: Family

## 2023-12-18 ENCOUNTER — Encounter: Payer: Self-pay | Admitting: Family

## 2023-12-18 VITALS — BP 139/78 | HR 56 | Temp 97.2°F | Ht 70.0 in | Wt 198.0 lb

## 2023-12-18 DIAGNOSIS — E785 Hyperlipidemia, unspecified: Secondary | ICD-10-CM | POA: Diagnosis not present

## 2023-12-18 DIAGNOSIS — Z8679 Personal history of other diseases of the circulatory system: Secondary | ICD-10-CM

## 2023-12-18 DIAGNOSIS — E039 Hypothyroidism, unspecified: Secondary | ICD-10-CM

## 2023-12-18 DIAGNOSIS — K219 Gastro-esophageal reflux disease without esophagitis: Secondary | ICD-10-CM | POA: Diagnosis not present

## 2023-12-18 DIAGNOSIS — E663 Overweight: Secondary | ICD-10-CM

## 2023-12-18 MED ORDER — OMEPRAZOLE 40 MG PO CPDR
40.0000 mg | DELAYED_RELEASE_CAPSULE | Freq: Every day | ORAL | 2 refills | Status: DC
Start: 2023-12-18 — End: 2024-06-20

## 2023-12-18 NOTE — Patient Instructions (Signed)

## 2023-12-18 NOTE — Progress Notes (Signed)
Subjective:    Patient ID: Brian Briggs, male    DOB: [birth year], 63 y.o.   MRN: [id number]  Chief Complaint  Patient presents with   Medical Management of Chronic Issues    No concerns or questions. Patient is fasting    PT presents to the office today for chronic follow up. He has a hx of CVA in 2007. He takes aggrenox BID.   Gastroesophageal Reflux He complains of belching and heartburn. He reports no hoarse voice. This is a chronic problem. The current episode started more than 1 year ago. The problem occurs occasionally. Pertinent negatives include no fatigue. He has tried a PPI for the symptoms. The treatment provided moderate relief.  Thyroid Problem Presents for follow-up visit. Patient reports no constipation, diarrhea, fatigue or hoarse voice. The symptoms have been stable.  Hyperlipidemia This is a chronic problem. The current episode started more than 1 year ago. The problem is controlled. Recent lipid tests were reviewed and are normal. Current antihyperlipidemic treatment includes statins. The current treatment provides moderate improvement of lipids. Risk factors for coronary artery disease include hypertension, a sedentary lifestyle, dyslipidemia and male sex.      Review of Systems  Constitutional:  Negative for fatigue.  HENT:  Negative for hoarse voice.   Gastrointestinal:  Positive for heartburn. Negative for constipation and diarrhea.  All other systems reviewed and are negative.  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L   Tobacco Use   Smoking status: Never   Smokeless tobacco: Never  Vaping Use   Vaping status: Never Used  Substance and Sexual Activity   Alcohol use: Yes    Alcohol/week: 3.0 standard drinks of alcohol    Types: 3 Cans of beer per week   Drug use: No   Sexual activity: Not on file  Other Topics Concern   Not on file  Social History Narrative   Lives with wife and son   Caffeine use: daily (coffee/tea)   Right handed    Social Drivers of Health   Financial Resource Strain: Not on file  Food Insecurity: Not on file  Transportation Needs: Not on file  Physical Activity: Not on file  Stress: Not on file  Social Connections: Unknown (05/25/2022)   Received from [facility]   Social Network    Social Network: Not on file        Objective:   Physical Exam Vitals reviewed.  Constitutional:      General: He is not in acute distress.    Appearance: He is well-developed.  HENT:     Head: Normocephalic.     Right Ear: Tympanic membrane normal.     Left Ear: Tympanic membrane normal.  Eyes:     General:        Right eye: No discharge.        Left eye: No discharge.     Pupils:  Pupils are equal, round, and reactive to light.  Neck:     Thyroid: No thyromegaly.  Cardiovascular:     Rate and Rhythm: Normal rate and regular rhythm.     Heart sounds: Normal heart sounds. No murmur heard. Pulmonary:     Effort: Pulmonary effort is normal. No respiratory distress.     Breath sounds: Normal breath sounds. No wheezing.  Abdominal:     General: Bowel sounds are normal. There is no distension.     Palpations: Abdomen is soft.     Tenderness: There is no abdominal tenderness.  Musculoskeletal:        General: No tenderness. Normal range of motion.     Cervical back: Normal range of motion and neck supple.  Skin:    General: Skin is warm and dry.     Findings: No erythema or rash.  Neurological:     Mental Status: He is alert and oriented to person, place,  and time.     Cranial Nerves: No cranial nerve deficit.     Deep Tendon Reflexes: Reflexes are normal and symmetric.  Psychiatric:        Behavior: Behavior normal.        Thought Content: Thought content normal.        Judgment: Judgment normal.       BP 139/78   Pulse (!) 56   Temp (!) 97.2 F (36.2 C) (Oral)   Ht 5\' 10"  (1.778 m)   Wt 198 lb (89.8 kg)   SpO2 100%   BMI 28.41 kg/m      Assessment & Plan:  DERWARD MARPLE comes in today with chief complaint of Medical Management of Chronic Issues (No concerns or questions. Patient is fasting )   Diagnosis and orders addressed:  1. Gastroesophageal reflux disease without esophagitis - omeprazole (PRILOSEC) 40 MG capsule; Take 1 capsule (40 mg total) by mouth daily.  Dispense: 90 capsule; Refill: 2 - CMP14+EGFR  2. History of cardiovascular disorder - CMP14+EGFR  3. Hyperlipidemia, unspecified hyperlipidemia type - CMP14+EGFR  4. Hypothyroidism, unspecified type (Primary) - CMP14+EGFR - TSH  5. Overweight (BMI 25.0-29.9) - CMP14+EGFR   Labs pending Continue current medications  Health Maintenance reviewed Diet and exercise encouraged  Follow up plan: 6 months    Jannifer Rodney, FNP

## 2023-12-19 LAB — CMP14+EGFR
ALT: 14 [IU]/L (ref 0–44)
AST: 33 [IU]/L (ref 0–40)
Albumin: 4.3 g/dL (ref 3.9–4.9)
Alkaline Phosphatase: 76 [IU]/L (ref 44–121)
BUN/Creatinine Ratio: 12 (ref 10–24)
BUN: 13 mg/dL (ref 8–27)
Bilirubin Total: 0.5 mg/dL (ref 0.0–1.2)
CO2: 23 mmol/L (ref 20–29)
Calcium: 9.1 mg/dL (ref 8.6–10.2)
Chloride: 105 mmol/L (ref 96–106)
Creatinine, Ser: 1.07 mg/dL (ref 0.76–1.27)
Globulin, Total: 1.8 g/dL (ref 1.5–4.5)
Glucose: 89 mg/dL (ref 70–99)
Potassium: 4.9 mmol/L (ref 3.5–5.2)
Sodium: 143 mmol/L (ref 134–144)
Total Protein: 6.1 g/dL (ref 6.0–8.5)
eGFR: 77 mL/min/{1.73_m2} (ref >59)

## 2023-12-19 LAB — TSH: TSH: 3.48 u[IU]/mL (ref 0.450–4.500)

## 2024-02-01 ENCOUNTER — Other Ambulatory Visit: Payer: Self-pay | Admitting: Family

## 2024-02-01 DIAGNOSIS — Z8673 Personal history of transient ischemic attack (TIA), and cerebral infarction without residual deficits: Secondary | ICD-10-CM

## 2024-02-01 DIAGNOSIS — Z8679 Personal history of other diseases of the circulatory system: Secondary | ICD-10-CM

## 2024-02-15 ENCOUNTER — Other Ambulatory Visit (HOSPITAL_COMMUNITY): Payer: Self-pay

## 2024-02-19 ENCOUNTER — Telehealth: Payer: Self-pay

## 2024-02-19 ENCOUNTER — Other Ambulatory Visit (HOSPITAL_COMMUNITY): Payer: Self-pay

## 2024-02-19 NOTE — Telephone Encounter (Signed)
 Pharmacy Patient Advocate Encounter   Received notification from CoverMyMeds that prior authorization for Aspirin-Dipyridamole ER 25-200 MG is required/requested.   Insurance verification completed.   The patient is insured through [employer] .   Per test claim: Refill too soon. PA is not needed at this time. Medication was filled 02/05/2024. Next eligible fill date is 03/01/2024.

## 2024-02-20 NOTE — Telephone Encounter (Signed)
 A user error has taken place: orders placed in error, not carried out on this patient.

## 2024-03-06 ENCOUNTER — Other Ambulatory Visit (HOSPITAL_COMMUNITY): Payer: Self-pay

## 2024-03-06 ENCOUNTER — Telehealth: Payer: Self-pay | Admitting: Pharmacy Technician

## 2024-03-06 NOTE — Telephone Encounter (Signed)
 Pharmacy Patient Advocate Encounter  Received notification from [employer] that Prior Authorization for Aspirin -Dipyridamole  ER 25-200MG  er capsules has been APPROVED from 03/06/2024 to 11/13/2024. Ran test claim, Copay is $24.00. This test claim was processed through [facility]- copay amounts may vary at other pharmacies due to pharmacy/plan contracts, or as the patient moves through the different stages of their insurance plan.   PA #/Case ID/Reference #: [id number]

## 2024-03-06 NOTE — Telephone Encounter (Signed)
 Pharmacy Patient Advocate Encounter   Received notification from [employer] that prior authorization for Aspirin -Dipyridamole  ER 25-200MG  er capsules is required/requested.   Insurance verification completed.   The patient is insured through [employer] .   Per test claim: PA required; PA submitted to above mentioned insurance via CoverMyMeds Key/confirmation #/EOC [facility] Status is pending

## 2024-03-06 NOTE — Telephone Encounter (Signed)
 Pt made aware and he will call the pharmacy for them to get it ready.

## 2024-03-19 DIAGNOSIS — D485 Neoplasm of uncertain behavior of skin: Secondary | ICD-10-CM | POA: Diagnosis not present

## 2024-03-19 DIAGNOSIS — L989 Disorder of the skin and subcutaneous tissue, unspecified: Secondary | ICD-10-CM | POA: Diagnosis not present

## 2024-03-19 DIAGNOSIS — L98499 Non-pressure chronic ulcer of skin of other sites with unspecified severity: Secondary | ICD-10-CM | POA: Diagnosis not present

## 2024-03-19 DIAGNOSIS — D0439 Carcinoma in situ of skin of other parts of face: Secondary | ICD-10-CM | POA: Diagnosis not present

## 2024-03-19 DIAGNOSIS — L570 Actinic keratosis: Secondary | ICD-10-CM | POA: Diagnosis not present

## 2024-03-25 ENCOUNTER — Encounter (HOSPITAL_COMMUNITY): Payer: Self-pay

## 2024-03-28 DIAGNOSIS — C44329 Squamous cell carcinoma of skin of other parts of face: Secondary | ICD-10-CM | POA: Diagnosis not present

## 2024-04-01 ENCOUNTER — Ambulatory Visit: Admitting: Family

## 2024-04-01 ENCOUNTER — Encounter: Payer: Self-pay | Admitting: Family

## 2024-04-01 VITALS — BP 109/63 | HR 68 | Temp 97.6°F | Ht 70.0 in | Wt 190.2 lb

## 2024-04-01 DIAGNOSIS — Z0000 Encounter for general adult medical examination without abnormal findings: Secondary | ICD-10-CM

## 2024-04-01 NOTE — Patient Instructions (Signed)
 Health Maintenance After Age 65 After age 4, you are at a higher risk for certain long-term diseases and infections as well as injuries from falls. Falls are a major cause of broken bones and head injuries in people who are older than age 47. Getting regular preventive care can help to keep you healthy and well. Preventive care includes getting regular testing and making lifestyle changes as recommended by your health care provider. Talk with your health care provider about: Which screenings and tests you should have. A screening is a test that checks for a disease when you have no symptoms. A diet and exercise plan that is right for you. What should I know about screenings and tests to prevent falls? Screening and testing are the best ways to find a health problem early. Early diagnosis and treatment give you the best chance of managing medical conditions that are common after age 37. Certain conditions and lifestyle choices may make you more likely to have a fall. Your health care provider may recommend: Regular vision checks. Poor vision and conditions such as cataracts can make you more likely to have a fall. If you wear glasses, make sure to get your prescription updated if your vision changes. Medicine review. Work with your health care provider to regularly review all of the medicines you are taking, including over-the-counter medicines. Ask your health care provider about any side effects that may make you more likely to have a fall. Tell your health care provider if any medicines that you take make you feel dizzy or sleepy. Strength and balance checks. Your health care provider may recommend certain tests to check your strength and balance while standing, walking, or changing positions. Foot health exam. Foot pain and numbness, as well as not wearing proper footwear, can make you more likely to have a fall. Screenings, including: Osteoporosis screening. Osteoporosis is a condition that causes  the bones to get weaker and break more easily. Blood pressure screening. Blood pressure changes and medicines to control blood pressure can make you feel dizzy. Depression screening. You may be more likely to have a fall if you have a fear of falling, feel depressed, or feel unable to do activities that you used to do. Alcohol use screening. Using too much alcohol can affect your balance and may make you more likely to have a fall. Follow these instructions at home: Lifestyle Do not drink alcohol if: Your health care provider tells you not to drink. If you drink alcohol: Limit how much you have to: 0-1 drink a day for women. 0-2 drinks a day for men. Know how much alcohol is in your drink. In the [place] one drink equals one 12 oz bottle of beer (355 mL), one 5 oz glass of wine (148 mL), or one 1 oz glass of hard liquor (44 mL). Do not use any products that contain nicotine or tobacco. These products include cigarettes, chewing tobacco, and vaping devices, such as e-cigarettes. If you need help quitting, ask your health care provider. Activity  Follow a regular exercise program to stay fit. This will help you maintain your balance. Ask your health care provider what types of exercise are appropriate for you. If you need a cane or walker, use it as recommended by your health care provider. Wear supportive shoes that have nonskid soles. Safety  Remove any tripping hazards, such as rugs, cords, and clutter. Install safety equipment such as grab bars in bathrooms and safety rails on stairs. Keep rooms and walkways  well-lit. General instructions Talk with your health care provider about your risks for falling. Tell your health care provider if: You fall. Be sure to tell your health care provider about all falls, even ones that seem minor. You feel dizzy, tiredness (fatigue), or off-balance. Take over-the-counter and prescription medicines only as told by your health care provider. These include  supplements. Eat a healthy diet and maintain a healthy weight. A healthy diet includes low-fat dairy products, low-fat (lean) meats, and fiber from whole grains, beans, and lots of fruits and vegetables. Stay current with your vaccines. Schedule regular health, dental, and eye exams. Summary Having a healthy lifestyle and getting preventive care can help to protect your health and wellness after age 11. Screening and testing are the best way to find a health problem early and help you avoid having a fall. Early diagnosis and treatment give you the best chance for managing medical conditions that are more common for people who are older than age 28. Falls are a major cause of broken bones and head injuries in people who are older than age 48. Take precautions to prevent a fall at home. Work with your health care provider to learn what changes you can make to improve your health and wellness and to prevent falls. This information is not intended to replace advice given to you by your health care provider. Make sure you discuss any questions you have with your health care provider. Document Revised: 03/22/2021 Document Reviewed: 03/22/2021 Elsevier Patient Education  2024 [employer].

## 2024-04-01 NOTE — Progress Notes (Signed)
 Subjective:    JOSIAH NIETO is a 63 y.o. male who presents for a Welcome to Medicare exam.   Cardiac Risk Factors include: advanced age (>56men, >88 women)     Objective:     Today's Vitals   04/01/24 1143  BP: 109/63  Pulse: 68  Temp: 97.6 F (36.4 C)  TempSrc: Temporal  SpO2: 97%  Weight: 190 lb 3.2 oz (86.3 kg)  Height: 5\' 10"  (1.778 m)   Body mass index is 27.29 kg/m.  Medications Outpatient Encounter Medications as of 04/01/2024  Medication Sig   aspirin  81 MG EC tablet Take 81 mg by mouth daily.   atorvastatin  (LIPITOR) 40 MG tablet Take 1 tablet (40 mg total) by mouth daily.   dipyridamole -aspirin  (AGGRENOX ) 200-25 MG 12hr capsule TAKE ONE CAPSULE BY MOUTH TWICE DAILY   fluticasone  (FLONASE ) 50 MCG/ACT nasal spray Place 2 sprays into both nostrils daily.   levothyroxine  (SYNTHROID ) 50 MCG tablet Take 1 tablet (50 mcg total) by mouth daily.   Multiple Vitamin (MULTIVITAMIN) tablet Take 1 tablet by mouth daily.   omeprazole  (PRILOSEC) 40 MG capsule Take 1 capsule (40 mg total) by mouth daily.   No facility-administered encounter medications on file as of 04/01/2024.     History: Past Medical History:  Diagnosis Date   CVA (cerebral vascular accident) (HCC) 2010   GERD (gastroesophageal reflux disease)    Hyperlipidemia    Thyroid  cyst    Thyroid  nodule    Past Surgical History:  Procedure Laterality Date   COLONOSCOPY  05/11/2009   SKIN CANCER EXCISION     THYROIDECTOMY, PARTIAL      Family History  Problem Relation Age of Onset   Hyperlipidemia Mother    Prostate cancer Father    Hyperlipidemia Brother    Colon polyps Neg Hx    Colon cancer Neg Hx    Esophageal cancer Neg Hx    Rectal cancer Neg Hx    Stomach cancer Neg Hx    Social History   Occupational History   Occupation: CDL  Tobacco Use   Smoking status: Never   Smokeless tobacco: Never  Vaping Use   Vaping status: Never Used  Substance and Sexual Activity   Alcohol use: Yes     Alcohol/week: 3.0 standard drinks of alcohol    Types: 3 Cans of beer per week   Drug use: No   Sexual activity: Not on file    Tobacco Counseling Counseling given: Not Answered   Immunizations and Health Maintenance Immunization History  Administered Date(s) Administered   Influenza,inj,Quad PF,6+ Mos 08/21/2015, 08/18/2016   Moderna Sars-Covid-2 Vaccination 07/21/2020, 08/18/2020   Td 01/21/2008, 02/12/2018   Zoster Recombinant(Shingrix) 06/03/2021, 12/06/2021   Health Maintenance Due  Topic Date Due   COVID-19 Vaccine (3 - 2024-25 season) 07/16/2023    Activities of Daily Living    04/01/2024   11:35 AM  In your present state of health, do you have any difficulty performing the following activities:  Hearing? 0  Vision? 0  Difficulty concentrating or making decisions? 0  Walking or climbing stairs? 0  Dressing or bathing? 0  Doing errands, shopping? 0  Preparing Food and eating ? N  Using the Toilet? N  In the past six months, have you accidently leaked urine? N  Do you have problems with loss of bowel control? N  Managing your Medications? N  Managing your Finances? N  Housekeeping or managing your Housekeeping? N    Physical Exam  Physical Exam (optional), or other factors deemed appropriate based on the beneficiary's medical and social history and current clinical standards.   Advanced Directives: Does Patient Have a Medical Advance Directive?: No Would patient like information on creating a medical advance directive?: No - Patient declined   EKG:  normal EKG, normal sinus rhythm, unchanged from previous tracings     Assessment:     This is a routine wellness  examination for this patient . No complaints today.  Vision/Hearing screen No results found.   Goals      DIET - INCREASE WATER INTAKE         Depression Screen    04/01/2024   11:42 AM 12/18/2023    8:36 AM 06/16/2023    8:37 AM 12/08/2022    8:05 AM  PHQ 2/9 Scores  PHQ - 2  Score 0 0 0 0  PHQ- 9 Score 0  0 0     Fall Risk    04/01/2024   11:40 AM  Fall Risk   Falls in the past year? 0  Number falls in past yr: 0  Injury with Fall? 0  Risk for fall due to : No Fall Risks  Follow up Falls evaluation completed    Cognitive Function        04/01/2024   11:41 AM  6CIT Screen  What Year? 0 points  What month? 0 points  What time? 0 points  Count back from 20 0 points  Months in reverse 0 points  Repeat phrase 0 points  Total Score 0 points    Patient Care Team: Yevette Hem, FNP as PCP - General (Nurse Practitioner)     Plan:     I have personally reviewed and noted the following in the patient's chart:   Medical and social history Use of alcohol, tobacco or illicit drugs  Current medications and supplements including opioid prescriptions. Patient is not currently taking opioid prescriptions. Functional ability and status Nutritional status Physical activity Advanced directives List of other physicians Hospitalizations, surgeries, and ER visits in previous 12 months Vitals Screenings to include cognitive, depression, and falls Referrals and appointments  In addition, I have reviewed and discussed with patient certain preventive protocols, quality metrics, and best practice recommendations. A written personalized care plan for preventive services as well as general preventive health recommendations were provided to patient.     Tommas Fragmin, [place] 04/01/2024

## 2024-04-02 ENCOUNTER — Ambulatory Visit: Payer: Self-pay | Admitting: Family

## 2024-06-20 ENCOUNTER — Encounter: Payer: Self-pay | Admitting: Family

## 2024-06-20 ENCOUNTER — Ambulatory Visit: Payer: Medicare PPO | Admitting: Family

## 2024-06-20 VITALS — BP 110/54 | HR 60 | Temp 98.0°F | Ht 70.0 in | Wt 185.0 lb

## 2024-06-20 DIAGNOSIS — Z0000 Encounter for general adult medical examination without abnormal findings: Secondary | ICD-10-CM

## 2024-06-20 DIAGNOSIS — E785 Hyperlipidemia, unspecified: Secondary | ICD-10-CM | POA: Diagnosis not present

## 2024-06-20 DIAGNOSIS — Z8673 Personal history of transient ischemic attack (TIA), and cerebral infarction without residual deficits: Secondary | ICD-10-CM

## 2024-06-20 DIAGNOSIS — Z0001 Encounter for general adult medical examination with abnormal findings: Secondary | ICD-10-CM

## 2024-06-20 DIAGNOSIS — E663 Overweight: Secondary | ICD-10-CM | POA: Diagnosis not present

## 2024-06-20 DIAGNOSIS — Z23 Encounter for immunization: Secondary | ICD-10-CM

## 2024-06-20 DIAGNOSIS — K219 Gastro-esophageal reflux disease without esophagitis: Secondary | ICD-10-CM | POA: Diagnosis not present

## 2024-06-20 DIAGNOSIS — E039 Hypothyroidism, unspecified: Secondary | ICD-10-CM | POA: Diagnosis not present

## 2024-06-20 LAB — LIPID PANEL

## 2024-06-20 MED ORDER — ATORVASTATIN CALCIUM 40 MG PO TABS
40.0000 mg | ORAL_TABLET | Freq: Every day | ORAL | 4 refills | Status: AC
Start: 2024-06-20 — End: ?

## 2024-06-20 MED ORDER — OMEPRAZOLE 40 MG PO CPDR
40.0000 mg | DELAYED_RELEASE_CAPSULE | Freq: Every day | ORAL | 4 refills | Status: AC
Start: 2024-06-20 — End: ?

## 2024-06-20 MED ORDER — LEVOTHYROXINE SODIUM 50 MCG PO TABS: 50.0000 ug | ORAL_TABLET | Freq: Every day | ORAL | 4 refills | Status: AC

## 2024-06-20 NOTE — Addendum Note (Signed)
 Addended by: MILAS KENT D on: 06/20/2024 08:50 AM   Modules accepted: Orders

## 2024-06-20 NOTE — Patient Instructions (Signed)
 Health Maintenance After Age 65 After age 4, you are at a higher risk for certain long-term diseases and infections as well as injuries from falls. Falls are a major cause of broken bones and head injuries in people who are older than age 47. Getting regular preventive care can help to keep you healthy and well. Preventive care includes getting regular testing and making lifestyle changes as recommended by your health care provider. Talk with your health care provider about: Which screenings and tests you should have. A screening is a test that checks for a disease when you have no symptoms. A diet and exercise plan that is right for you. What should I know about screenings and tests to prevent falls? Screening and testing are the best ways to find a health problem early. Early diagnosis and treatment give you the best chance of managing medical conditions that are common after age 37. Certain conditions and lifestyle choices may make you more likely to have a fall. Your health care provider may recommend: Regular vision checks. Poor vision and conditions such as cataracts can make you more likely to have a fall. If you wear glasses, make sure to get your prescription updated if your vision changes. Medicine review. Work with your health care provider to regularly review all of the medicines you are taking, including over-the-counter medicines. Ask your health care provider about any side effects that may make you more likely to have a fall. Tell your health care provider if any medicines that you take make you feel dizzy or sleepy. Strength and balance checks. Your health care provider may recommend certain tests to check your strength and balance while standing, walking, or changing positions. Foot health exam. Foot pain and numbness, as well as not wearing proper footwear, can make you more likely to have a fall. Screenings, including: Osteoporosis screening. Osteoporosis is a condition that causes  the bones to get weaker and break more easily. Blood pressure screening. Blood pressure changes and medicines to control blood pressure can make you feel dizzy. Depression screening. You may be more likely to have a fall if you have a fear of falling, feel depressed, or feel unable to do activities that you used to do. Alcohol use screening. Using too much alcohol can affect your balance and may make you more likely to have a fall. Follow these instructions at home: Lifestyle Do not drink alcohol if: Your health care provider tells you not to drink. If you drink alcohol: Limit how much you have to: 0-1 drink a day for women. 0-2 drinks a day for men. Know how much alcohol is in your drink. In the [place] one drink equals one 12 oz bottle of beer (355 mL), one 5 oz glass of wine (148 mL), or one 1 oz glass of hard liquor (44 mL). Do not use any products that contain nicotine or tobacco. These products include cigarettes, chewing tobacco, and vaping devices, such as e-cigarettes. If you need help quitting, ask your health care provider. Activity  Follow a regular exercise program to stay fit. This will help you maintain your balance. Ask your health care provider what types of exercise are appropriate for you. If you need a cane or walker, use it as recommended by your health care provider. Wear supportive shoes that have nonskid soles. Safety  Remove any tripping hazards, such as rugs, cords, and clutter. Install safety equipment such as grab bars in bathrooms and safety rails on stairs. Keep rooms and walkways  well-lit. General instructions Talk with your health care provider about your risks for falling. Tell your health care provider if: You fall. Be sure to tell your health care provider about all falls, even ones that seem minor. You feel dizzy, tiredness (fatigue), or off-balance. Take over-the-counter and prescription medicines only as told by your health care provider. These include  supplements. Eat a healthy diet and maintain a healthy weight. A healthy diet includes low-fat dairy products, low-fat (lean) meats, and fiber from whole grains, beans, and lots of fruits and vegetables. Stay current with your vaccines. Schedule regular health, dental, and eye exams. Summary Having a healthy lifestyle and getting preventive care can help to protect your health and wellness after age 11. Screening and testing are the best way to find a health problem early and help you avoid having a fall. Early diagnosis and treatment give you the best chance for managing medical conditions that are more common for people who are older than age 28. Falls are a major cause of broken bones and head injuries in people who are older than age 48. Take precautions to prevent a fall at home. Work with your health care provider to learn what changes you can make to improve your health and wellness and to prevent falls. This information is not intended to replace advice given to you by your health care provider. Make sure you discuss any questions you have with your health care provider. Document Revised: 03/22/2021 Document Reviewed: 03/22/2021 Elsevier Patient Education  2024 [employer].

## 2024-06-20 NOTE — Progress Notes (Signed)
 Subjective:    Patient ID: Brian Briggs, male    DOB: [birth year], 63 y.o.   MRN: [id number]  Chief Complaint  Patient presents with   Medical Management of Chronic Issues   PT presents to the office today for CPE.   He has a hx of CVA in 2007. He takes aggrenox  BID.   Gastroesophageal Reflux He complains of belching and heartburn. He reports no hoarse voice. This is a chronic problem. The current episode started more than 1 year ago. The problem occurs occasionally. The symptoms are aggravated by certain foods. Pertinent negatives include no fatigue. He has tried a PPI for the symptoms. The treatment provided moderate relief.  Thyroid  Problem Presents for follow-up visit. Patient reports no constipation, diarrhea, dry skin, fatigue or hoarse voice. The symptoms have been stable.  Hyperlipidemia This is a chronic problem. The current episode started more than 1 year ago. The problem is controlled. Recent lipid tests were reviewed and are normal. Current antihyperlipidemic treatment includes statins. The current treatment provides moderate improvement of lipids. Risk factors for coronary artery disease include hypertension, a sedentary lifestyle, dyslipidemia and male sex.      Review of Systems  Constitutional:  Negative for fatigue.  HENT:  Negative for hoarse voice.   Gastrointestinal:  Positive for heartburn. Negative for constipation and diarrhea.  All other systems reviewed and are negative.  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[employer]   Tobacco Use   Smoking status: Never   Smokeless tobacco: Never  Vaping Use   Vaping status: Never Used  Substance and Sexual Activity   Alcohol use: Yes    Alcohol/week: 3.0 standard drinks of alcohol    Types: 3 Cans of beer per week   Drug use: No   Sexual activity: Not on file  Other Topics Concern   Not on file  Social History Narrative   Lives with wife and son   Caffeine use: daily (coffee/tea)   Right handed    Social Drivers of Health   Financial Resource Strain: Low Risk  (04/01/2024)   Overall Financial Resource Strain (CARDIA)    Difficulty of Paying Living Expenses: Not hard at all  Food Insecurity: No Food Insecurity (04/01/2024)   Hunger Vital Sign    Worried About Running Out of Food in the Last Year: Never true    Ran Out of Food in the Last Year: Never true  Transportation Needs: No Transportation Needs (04/01/2024)   PRAPARE - Administrator, Civil Service (Medical): No    Lack of Transportation (Non-Medical): No  Physical Activity: Insufficiently Active (04/01/2024)   Exercise Vital Sign    Days of Exercise per Week: 3 days    Minutes of Exercise per Session: 30 min  Stress: No Stress Concern Present (04/01/2024)   [employer] of Occupational Health - Occupational Stress Questionnaire    Feeling of Stress : Not  at all  Social Connections: Moderately Isolated (04/01/2024)   Social Connection and Isolation Panel    Frequency of Communication with Friends and Family: More than three times a week    Frequency of Social Gatherings with Friends and Family: More than three times a week    Attends Religious Services: Never    Database administrator or Organizations: No    Attends Banker Meetings: Never    Marital Status: Married        Objective:   Physical Exam Vitals reviewed.  Constitutional:      General: He is not in acute distress.    Appearance: He is well-developed.  HENT:     Head: Normocephalic.     Right Ear:  Tympanic membrane normal.     Left Ear: Tympanic membrane normal.  Eyes:     General:        Right eye: No discharge.        Left eye: No discharge.     Pupils: Pupils are equal, round, and reactive to light.  Neck:     Thyroid : No thyromegaly.  Cardiovascular:     Rate and Rhythm: Normal rate and regular rhythm.     Heart sounds: Normal heart sounds. No murmur heard. Pulmonary:     Effort: Pulmonary effort is normal. No respiratory distress.     Breath sounds: Normal breath sounds. No wheezing.  Abdominal:     General: Bowel sounds are normal. There is no distension.     Palpations: Abdomen is soft.     Tenderness: There is no abdominal tenderness.  Musculoskeletal:        General: No tenderness. Normal range of motion.     Cervical back: Normal range of motion and neck supple.  Skin:    General: Skin is warm and dry.     Findings: No erythema or rash.  Neurological:     Mental Status: He is alert and oriented to person, place, and time.     Cranial Nerves: No cranial nerve deficit.     Deep Tendon Reflexes: Reflexes are normal and symmetric.  Psychiatric:        Behavior: Behavior normal.        Thought Content: Thought content normal.        Judgment: Judgment normal.       BP (!) 110/54   Pulse 60   Temp 98 F (36.7 C)   Ht 5' 10 (1.778 m)   Wt 185 lb (83.9 kg)   SpO2 99%   BMI 26.54 kg/m      Assessment & Plan:  Brian Briggs comes in today with chief complaint of Medical Management of Chronic Issues   Diagnosis and orders addressed:  1. Annual physical exam (Primary) - CMP14+EGFR - CBC with Differential/Platelet - Lipid panel - PSA, total and free - TSH  2. Hyperlipidemia, unspecified hyperlipidemia type - CMP14+EGFR - CBC with Differential/Platelet - Lipid panel - atorvastatin  (LIPITOR) 40 MG tablet; Take 1 tablet (40 mg total) by mouth daily.  Dispense: 90 tablet; Refill: 4  3. Gastroesophageal reflux disease without esophagitis -  CMP14+EGFR - CBC with Differential/Platelet - omeprazole  (PRILOSEC) 40 MG capsule; Take 1 capsule (40 mg total) by mouth daily.  Dispense: 90 capsule; Refill: 4  4. Overweight (BMI 25.0-29.9) - CMP14+EGFR - CBC with Differential/Platelet  5. Hypothyroidism, unspecified type  - CMP14+EGFR - CBC with Differential/Platelet - TSH - levothyroxine  (SYNTHROID ) 50 MCG tablet; Take 1 tablet (50  mcg total) by mouth daily.  Dispense: 90 tablet; Refill: 4  6. History of CVA (cerebrovascular accident) - CMP14+EGFR - CBC with Differential/Platelet   Labs pending Continue current medications  Health Maintenance reviewed Diet and exercise encouraged  Follow up plan: 6 months    Bari Learn, FNP

## 2024-06-21 ENCOUNTER — Ambulatory Visit: Payer: Self-pay | Admitting: Family

## 2024-06-21 LAB — CBC WITH DIFFERENTIAL/PLATELET
Basophils Absolute: 0 x10E3/uL (ref 0.0–0.2)
Basos: 0 %
EOS (ABSOLUTE): 0 x10E3/uL (ref 0.0–0.4)
Eos: 1 %
Hematocrit: 41.5 % (ref 37.5–51.0)
Hemoglobin: 14.3 g/dL (ref 13.0–17.7)
Immature Grans (Abs): 0 x10E3/uL (ref 0.0–0.1)
Immature Granulocytes: 0 %
Lymphocytes Absolute: 1.3 x10E3/uL (ref 0.7–3.1)
Lymphs: 38 %
MCH: 31.8 pg (ref 26.6–33.0)
MCHC: 34.5 g/dL (ref 31.5–35.7)
MCV: 92 fL (ref 79–97)
Monocytes Absolute: 0.4 x10E3/uL (ref 0.1–0.9)
Monocytes: 13 %
Neutrophils Absolute: 1.7 x10E3/uL (ref 1.4–7.0)
Neutrophils: 48 %
Platelets: 145 x10E3/uL — ABNORMAL LOW (ref 150–450)
RBC: 4.49 x10E6/uL (ref 4.14–5.80)
RDW: 13 % (ref 11.6–15.4)
WBC: 3.5 x10E3/uL (ref 3.4–10.8)

## 2024-06-21 LAB — CMP14+EGFR
ALT: 12 IU/L (ref 0–44)
AST: 29 IU/L (ref 0–40)
Albumin: 4.4 g/dL (ref 3.9–4.9)
Alkaline Phosphatase: 83 IU/L (ref 44–121)
BUN/Creatinine Ratio: 15 (ref 10–24)
BUN: 15 mg/dL (ref 8–27)
Bilirubin Total: 0.8 mg/dL (ref 0.0–1.2)
CO2: 21 mmol/L (ref 20–29)
Calcium: 9.1 mg/dL (ref 8.6–10.2)
Chloride: 103 mmol/L (ref 96–106)
Creatinine, Ser: 1 mg/dL (ref 0.76–1.27)
Globulin, Total: 1.8 g/dL (ref 1.5–4.5)
Glucose: 95 mg/dL (ref 70–99)
Potassium: 4.4 mmol/L (ref 3.5–5.2)
Sodium: 140 mmol/L (ref 134–144)
Total Protein: 6.2 g/dL (ref 6.0–8.5)
eGFR: 84 mL/min/1.73 (ref >59)

## 2024-06-21 LAB — LIPID PANEL
Cholesterol, Total: 120 mg/dL (ref 100–199)
HDL: 42 mg/dL (ref >39)
LDL CALC COMMENT:: 2.9 ratio (ref 0.0–5.0)
LDL Chol Calc (NIH): 60 mg/dL (ref 0–99)
Triglycerides: 91 mg/dL (ref 0–149)
VLDL Cholesterol Cal: 18 mg/dL (ref 5–40)

## 2024-06-21 LAB — PSA, TOTAL AND FREE
PSA, Free Pct: 70
PSA, Free: 0.35 ng/mL
Prostate Specific Ag, Serum: 0.5 ng/mL (ref 0.0–4.0)

## 2024-06-21 LAB — TSH: TSH: 3.27 u[IU]/mL (ref 0.450–4.500)

## 2024-07-09 ENCOUNTER — Other Ambulatory Visit: Payer: Self-pay | Admitting: Family

## 2024-08-08 ENCOUNTER — Other Ambulatory Visit: Payer: Self-pay | Admitting: Family

## 2024-08-08 DIAGNOSIS — Z8679 Personal history of other diseases of the circulatory system: Secondary | ICD-10-CM

## 2024-08-08 DIAGNOSIS — Z8673 Personal history of transient ischemic attack (TIA), and cerebral infarction without residual deficits: Secondary | ICD-10-CM

## 2024-09-18 DIAGNOSIS — I781 Nevus, non-neoplastic: Secondary | ICD-10-CM | POA: Diagnosis not present

## 2024-09-18 DIAGNOSIS — L573 Poikiloderma of Civatte: Secondary | ICD-10-CM | POA: Diagnosis not present

## 2024-09-18 DIAGNOSIS — L570 Actinic keratosis: Secondary | ICD-10-CM | POA: Diagnosis not present

## 2024-09-18 DIAGNOSIS — Z85828 Personal history of other malignant neoplasm of skin: Secondary | ICD-10-CM | POA: Diagnosis not present

## 2024-12-23 ENCOUNTER — Ambulatory Visit: Payer: Self-pay | Admitting: Family
